# Patient Record
Sex: Female | Born: 1980 | Race: White | Hispanic: No | Marital: Single | Smoking: Never smoker
Health system: Southern US, Community
[De-identification: ages and names within clinical notes are randomized; demographics above are authoritative.]

## PROBLEM LIST (undated history)

## (undated) DIAGNOSIS — F39 Unspecified mood [affective] disorder: Secondary | ICD-10-CM

## (undated) DIAGNOSIS — M2550 Pain in unspecified joint: Secondary | ICD-10-CM

## (undated) DIAGNOSIS — M549 Dorsalgia, unspecified: Secondary | ICD-10-CM

## (undated) DIAGNOSIS — E669 Obesity, unspecified: Secondary | ICD-10-CM

## (undated) DIAGNOSIS — L709 Acne, unspecified: Secondary | ICD-10-CM

## (undated) DIAGNOSIS — I10 Essential (primary) hypertension: Secondary | ICD-10-CM

## (undated) DIAGNOSIS — K589 Irritable bowel syndrome without diarrhea: Secondary | ICD-10-CM

## (undated) DIAGNOSIS — J45909 Unspecified asthma, uncomplicated: Secondary | ICD-10-CM

## (undated) DIAGNOSIS — F419 Anxiety disorder, unspecified: Secondary | ICD-10-CM

## (undated) DIAGNOSIS — E282 Polycystic ovarian syndrome: Secondary | ICD-10-CM

## (undated) DIAGNOSIS — E7800 Pure hypercholesterolemia, unspecified: Secondary | ICD-10-CM

## (undated) DIAGNOSIS — E559 Vitamin D deficiency, unspecified: Secondary | ICD-10-CM

## (undated) DIAGNOSIS — K5900 Constipation, unspecified: Secondary | ICD-10-CM

## (undated) HISTORY — DX: Irritable bowel syndrome, unspecified: K58.9

## (undated) HISTORY — DX: Unspecified mood (affective) disorder: F39

## (undated) HISTORY — DX: Polycystic ovarian syndrome: E28.2

## (undated) HISTORY — DX: Dorsalgia, unspecified: M54.9

## (undated) HISTORY — DX: Constipation, unspecified: K59.00

## (undated) HISTORY — DX: Acne, unspecified: L70.9

## (undated) HISTORY — DX: Pure hypercholesterolemia, unspecified: E78.00

## (undated) HISTORY — DX: Essential (primary) hypertension: I10

## (undated) HISTORY — PX: BACK SURGERY: SHX140

## (undated) HISTORY — DX: Vitamin D deficiency, unspecified: E55.9

## (undated) HISTORY — DX: Anxiety disorder, unspecified: F41.9

## (undated) HISTORY — DX: Unspecified asthma, uncomplicated: J45.909

## (undated) HISTORY — DX: Pain in unspecified joint: M25.50

## (undated) HISTORY — DX: Obesity, unspecified: E66.9

---
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
## 2001-11-26 ENCOUNTER — Other Ambulatory Visit: Admission: RE | Admit: 2001-11-26 | Discharge: 2001-11-26 | Payer: Self-pay | Admitting: Gynecology

## 2003-01-03 ENCOUNTER — Other Ambulatory Visit: Admission: RE | Admit: 2003-01-03 | Discharge: 2003-01-03 | Payer: Self-pay | Admitting: Gynecology

## 2005-04-05 ENCOUNTER — Other Ambulatory Visit: Admission: RE | Admit: 2005-04-05 | Discharge: 2005-04-05 | Payer: Self-pay | Admitting: Family Medicine

## 2007-02-14 ENCOUNTER — Other Ambulatory Visit: Admission: RE | Admit: 2007-02-14 | Discharge: 2007-02-14 | Payer: Self-pay | Admitting: Family Medicine

## 2008-03-26 ENCOUNTER — Other Ambulatory Visit: Admission: RE | Admit: 2008-03-26 | Discharge: 2008-03-26 | Payer: Self-pay | Admitting: Family Medicine

## 2009-03-31 ENCOUNTER — Other Ambulatory Visit: Admission: RE | Admit: 2009-03-31 | Discharge: 2009-03-31 | Payer: Self-pay | Admitting: Family Medicine

## 2010-05-12 ENCOUNTER — Other Ambulatory Visit: Payer: Self-pay | Admitting: Family Medicine

## 2010-05-12 ENCOUNTER — Other Ambulatory Visit (HOSPITAL_COMMUNITY)
Admission: RE | Admit: 2010-05-12 | Discharge: 2010-05-12 | Disposition: A | Discharge status: Home / Self Care | Payer: BC Managed Care – PPO | Source: Ambulatory Visit | Attending: Family Medicine | Admitting: Family Medicine

## 2010-05-12 DIAGNOSIS — Z124 Encounter for screening for malignant neoplasm of cervix: Secondary | ICD-10-CM | POA: Insufficient documentation

## 2011-08-05 ENCOUNTER — Ambulatory Visit: Payer: Self-pay | Admitting: Sports Medicine

## 2012-02-15 HISTORY — PX: BACK SURGERY: SHX140

## 2013-02-14 DIAGNOSIS — F419 Anxiety disorder, unspecified: Secondary | ICD-10-CM

## 2013-02-14 HISTORY — PX: COLPOSCOPY W/ BIOPSY / CURETTAGE: SUR283

## 2013-02-14 HISTORY — DX: Anxiety disorder, unspecified: F41.9

## 2015-03-26 LAB — HM PAP SMEAR

## 2017-01-25 DIAGNOSIS — E66812 Obesity, class 2: Secondary | ICD-10-CM | POA: Insufficient documentation

## 2017-01-25 DIAGNOSIS — Z6839 Body mass index (BMI) 39.0-39.9, adult: Secondary | ICD-10-CM | POA: Insufficient documentation

## 2017-03-26 NOTE — Progress Notes (Addendum)
Gynecology Annual Exam   PCP: Patient, No Pcp Per  Chief Complaint:  Chief Complaint  Patient presents with  . Gynecologic Exam    bleeding after intercourse sm.cramping  . Menorrhagia    History of Present Illness: Patient is a 37 y.o. G0P0000 presents for annual exam. The patient has no complaints today.   LMP: Patient's last menstrual period was 02/15/2017 (exact date). Irregular bleeding with IUD  The patient is sexually active. She currently uses IUD for contraception. She has dyspareunia.  The patient does perform self breast exams.  There is no notable family history of breast or ovarian cancer in her family.  The patient wears seatbelts: yes.   The patient has regular exercise: not asked.    The patient denies current symptoms of depression.    Review of Systems: ROS  Past Medical History:  Past Medical History:  Diagnosis Date  . Acne   . Anxiety 2015  . Backache   . Hypertension   . Mood disorder (HCC)   . Obesity   . PCOS (polycystic ovarian syndrome)     Past Surgical History:  Past Surgical History:  Procedure Laterality Date  . BACK SURGERY    . COLPOSCOPY W/ BIOPSY / CURETTAGE  2015   2016, 2017    Gynecologic History:  Patient's last menstrual period was 02/15/2017 (exact date). 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 Hypertension Father   . COPD Maternal Grandmother   . Brain cancer Paternal Grandfather   . Lung cancer Paternal Grandfather   . Hypertension Paternal Grandfather     Social History:  Social History   Socioeconomic History  . Marital status: Single    Spouse  name: Not on file  . Number of children: Not on file  . Years of education: Not on file  . Highest education level: Not on file  Social Needs  . Financial resource strain: Not on file  . Food insecurity - worry: Not on file  . Food insecurity - inability: Not on file  . Transportation needs - medical: Not on file  . Transportation needs - non-medical: Not on file  Occupational History  . Not on file  Tobacco Use  . Smoking status: Never Smoker  . Smokeless tobacco: Never Used  Substance and Sexual Activity  . Alcohol use: Yes    Comment: rare  . Drug use: No  . Sexual activity: Yes    Partners: Male    Birth control/protection: IUD  Other Topics Concern  . Not on file  Social History Narrative  . Not on file    Allergies:  Allergies no known allergies  Medications: Prior to Admission medications   Not on File    Physical Exam Vitals: There were no vitals taken for this visit.  General: NAD HEENT: normocephalic, anicteric Thyroid: no enlargement, no palpable nodules Pulmonary: No increased work of breathing, CTAB Cardiovascular: RRR, distal pulses 2+ Breast: Breast symmetrical, no tenderness, no palpable nodules or masses, no skin or nipple retraction present, no nipple discharge.  No axillary or supraclavicular lymphadenopathy. Abdomen: NABS, soft, non-tender, non-distended.  Umbilicus without lesions.  No hepatomegaly, splenomegaly or masses palpable. No evidence of hernia  Genitourinary:  External: Normal external female genitalia.  Normal urethral meatus, normal  Bartholin's and Skene's glands.    Vagina: Normal vaginal mucosa, no evidence of prolapse.    Cervix: Grossly normal in appearance, no bleeding, IUD strings visualized  Uterus: Non-enlarged, mobile, normal contour.  No CMT  Adnexa: ovaries non-enlarged, no adnexal masses  Rectal: deferred  Lymphatic: no evidence of inguinal lymphadenopathy Extremities: no edema, erythema, or tenderness Neurologic:  Grossly intact Psychiatric: mood appropriate, affect full  Female chaperone present for pelvic and breast  portions of the physical exam    Assessment: 37 y.o. G0P0000 routine annual exam  Plan: Problem List Items Addressed This Visit    None    Visit Diagnoses    Screening for malignant neoplasm of cervix       Relevant Orders   PapIG, HPV, rfx 16/18   Breast screening       Encounter for gynecological examination without abnormal finding          1) STI screening was not offered  2)  ASCCP guidelines and rational discussed.  Repeat pap following normal colposcopy 2017  3) Contraception - the patient is currently using  IUD.  She is happy with her current form of contraception and plans to continue  - placed 09/2014  4) Routine healthcare maintenance including cholesterol, diabetes screening discussed managed by PCP  5) Return in 1 year (on 03/27/2018) for Annual.   Vena AustriaAndreas Eryn Marandola, MD, Merlinda FrederickFACOG [facility] 03/27/2017, 5:26 PM

## 2017-03-27 ENCOUNTER — Encounter: Payer: Self-pay | Admitting: Obstetrics and Gynecology

## 2017-03-27 ENCOUNTER — Ambulatory Visit (INDEPENDENT_AMBULATORY_CARE_PROVIDER_SITE_OTHER): Payer: BC Managed Care – PPO | Admitting: Obstetrics and Gynecology

## 2017-03-27 DIAGNOSIS — Z01419 Encounter for gynecological examination (general) (routine) without abnormal findings: Secondary | ICD-10-CM | POA: Diagnosis not present

## 2017-03-27 DIAGNOSIS — Z124 Encounter for screening for malignant neoplasm of cervix: Secondary | ICD-10-CM | POA: Diagnosis not present

## 2017-03-27 DIAGNOSIS — Z1231 Encounter for screening mammogram for malignant neoplasm of breast: Secondary | ICD-10-CM

## 2017-03-27 DIAGNOSIS — Z1239 Encounter for other screening for malignant neoplasm of breast: Secondary | ICD-10-CM

## 2017-03-27 NOTE — Patient Instructions (Signed)
Preventive Care 18-39 Years, Female Preventive care refers to lifestyle choices and visits with your health care provider that can promote health and wellness. What does preventive care include?  A yearly physical exam. This is also called an annual well check.  Dental exams once or twice a year.  Routine eye exams. Ask your health care provider how often you should have your eyes checked.  Personal lifestyle choices, including: ? Daily care of your teeth and gums. ? Regular physical activity. ? Eating a healthy diet. ? Avoiding tobacco and drug use. ? Limiting alcohol use. ? Practicing safe sex. ? Taking vitamin and mineral supplements as recommended by your health care provider. What happens during an annual well check? The services and screenings done by your health care provider during your annual well check will depend on your age, overall health, lifestyle risk factors, and family history of disease. Counseling Your health care provider may ask you questions about your:  Alcohol use.  Tobacco use.  Drug use.  Emotional well-being.  Home and relationship well-being.  Sexual activity.  Eating habits.  Work and work Statistician.  Method of birth control.  Menstrual cycle.  Pregnancy history.  Screening You may have the following tests or measurements:  Height, weight, and BMI.  Diabetes screening. This is done by checking your blood sugar (glucose) after you have not eaten for a while (fasting).  Blood pressure.  Lipid and cholesterol levels. These may be checked every 5 years starting at age 66.  Skin check.  Hepatitis C blood test.  Hepatitis B blood test.  Sexually transmitted disease (STD) testing.  BRCA-related cancer screening. This may be done if you have a family history of breast, ovarian, tubal, or peritoneal cancers.  Pelvic exam and Pap test. This may be done every 3 years starting at age 40. Starting at age 59, this may be done every 5  years if you have a Pap test in combination with an HPV test.  Discuss your test results, treatment options, and if necessary, the need for more tests with your health care provider. Vaccines Your health care provider may recommend certain vaccines, such as:  Influenza vaccine. This is recommended every year.  Tetanus, diphtheria, and acellular pertussis (Tdap, Td) vaccine. You may need a Td booster every 10 years.  Varicella vaccine. You may need this if you have not been vaccinated.  HPV vaccine. If you are 69 or younger, you may need three doses over 6 months.  Measles, mumps, and rubella (MMR) vaccine. You may need at least one dose of MMR. You may also need a second dose.  Pneumococcal 13-valent conjugate (PCV13) vaccine. You may need this if you have certain conditions and were not previously vaccinated.  Pneumococcal polysaccharide (PPSV23) vaccine. You may need one or two doses if you smoke cigarettes or if you have certain conditions.  Meningococcal vaccine. One dose is recommended if you are age 27-21 years and a first-year college student living in a residence hall, or if you have one of several medical conditions. You may also need additional booster doses.  Hepatitis A vaccine. You may need this if you have certain conditions or if you travel or work in places where you may be exposed to hepatitis A.  Hepatitis B vaccine. You may need this if you have certain conditions or if you travel or work in places where you may be exposed to hepatitis B.  Haemophilus influenzae type b (Hib) vaccine. You may need this if  you have certain risk factors.  Talk to your health care provider about which screenings and vaccines you need and how often you need them. This information is not intended to replace advice given to you by your health care provider. Make sure you discuss any questions you have with your health care provider. Document Released: 03/29/2001 Document Revised: 10/21/2015  Document Reviewed: 12/02/2014 Elsevier Interactive Patient Education  [employer].

## 2017-03-30 LAB — PAPIG, HPV, RFX 16/18
HPV, high-risk: POSITIVE — AB
PAP Smear Comment: 0

## 2017-04-05 ENCOUNTER — Encounter: Payer: Self-pay | Admitting: Obstetrics and Gynecology

## 2017-04-06 ENCOUNTER — Encounter: Payer: Self-pay | Admitting: Obstetrics and Gynecology

## 2017-04-06 ENCOUNTER — Telehealth: Payer: Self-pay | Admitting: Obstetrics and Gynecology

## 2017-04-06 ENCOUNTER — Ambulatory Visit (INDEPENDENT_AMBULATORY_CARE_PROVIDER_SITE_OTHER): Payer: BC Managed Care – PPO | Admitting: Obstetrics and Gynecology

## 2017-04-06 VITALS — BP 126/84 | Ht 67.0 in | Wt 266.0 lb

## 2017-04-06 DIAGNOSIS — B373 Candidiasis of vulva and vagina: Secondary | ICD-10-CM

## 2017-04-06 DIAGNOSIS — Z113 Encounter for screening for infections with a predominantly sexual mode of transmission: Secondary | ICD-10-CM

## 2017-04-06 DIAGNOSIS — B3731 Acute candidiasis of vulva and vagina: Secondary | ICD-10-CM

## 2017-04-06 LAB — POCT WET PREP WITH KOH
Clue Cells Wet Prep HPF POC: NEGATIVE
KOH Prep POC: NEGATIVE
Trichomonas, UA: NEGATIVE

## 2017-04-06 MED ORDER — FLUCONAZOLE 150 MG PO TABS: 150.0000 mg | ORAL_TABLET | Freq: Once | ORAL | 0 refills | Status: AC

## 2017-04-06 NOTE — Progress Notes (Signed)
Chief Complaint  Patient presents with  . Vaginitis    HPI:      Ms. Carla Watson is a 37 y.o. G0P0000 who LMP was No LMP recorded. Patient is not currently having periods (Reason: IUD)., presents today for vaginal itching/irritation with possibly a little increased d/c, no odor. Sx for about 6 days. No meds to treat, no recent abx use. No urin sx, LBP, fevers, belly pain. New sex partner. Never had yeast vag before. Has colpo sched in 3/19.   Past Medical History:  Diagnosis Date  . Acne   . Anxiety 2015  . Backache   . Hypertension   . Mood disorder (HCC)   . Obesity   . PCOS (polycystic ovarian syndrome)     Past Surgical History:  Procedure Laterality Date  . BACK SURGERY    . COLPOSCOPY W/ BIOPSY / CURETTAGE  2015   2016, 2017    Family History  Problem Relation Age of Onset  . Hypertension Father   . COPD Maternal Grandmother   . Brain cancer Paternal Grandfather   . Lung cancer Paternal Grandfather   . Hypertension Paternal Grandfather     Social History   Socioeconomic History  . Marital status: Single    Spouse name: Not on file  . Number of children: Not on file  . Years of education: Not on file  . Highest education level: Not on file  Social Needs  . Financial resource strain: Not on file  . Food insecurity - worry: Not on file  . Food insecurity - inability: Not on file  . Transportation needs - medical: Not on file  . Transportation needs - non-medical: Not on file  Occupational History  . Not on file  Tobacco Use  . Smoking status: Never Smoker  . Smokeless tobacco: Never Used  Substance and Sexual Activity  . Alcohol use: Yes    Comment: rare  . Drug use: No  . Sexual activity: Yes    Partners: Male    Birth control/protection: IUD  Other Topics Concern  . Not on file  Social History Narrative  . Not on file     Current Outpatient Medications:  .  fluconazole (DIFLUCAN) 150 MG tablet, Take 1 tablet (150 mg total) by mouth  once for 1 dose., Disp: 1 tablet, Rfl: 0 .  levonorgestrel (MIRENA, 52 MG,) 20 MCG/24HR IUD, by Intrauterine route., Disp: , Rfl:  .  lisinopril-hydrochlorothiazide (PRINZIDE,ZESTORETIC) 10-12.5 MG tablet, Take by mouth., Disp: , Rfl:    ROS:  Review of Systems  Constitutional: Negative for fever.  Gastrointestinal: Negative for blood in stool, constipation, diarrhea, nausea and vomiting.  Genitourinary: Positive for vaginal discharge. Negative for dyspareunia, dysuria, flank pain, frequency, hematuria, urgency, vaginal bleeding and vaginal pain.  Musculoskeletal: Negative for back pain.  Skin: Negative for rash.     OBJECTIVE:   Vitals:  BP 126/84   Ht 5\' 7"  (1.702 m)   Wt 266 lb (120.7 kg)   BMI 41.66 kg/m   Physical Exam  Constitutional: She is oriented to person, place, and time and well-developed, well-nourished, and in no distress. Vital signs are normal.  Genitourinary: Uterus normal, cervix normal, right adnexa normal and left adnexa normal. Uterus is not enlarged. Cervix exhibits no motion tenderness and no tenderness. Right adnexum displays no mass and no tenderness. Left adnexum displays no mass and no tenderness. Vulva exhibits erythema and tenderness. Vulva exhibits no exudate, no lesion and no rash. Vagina exhibits  no lesion. Thin  white and vaginal discharge found.  Neurological: She is oriented to person, place, and time.  Vitals reviewed.   Results: Results for orders placed or performed in visit on 04/06/17 (from the past 24 hour(s))  POCT Wet Prep with KOH     Status: Abnormal   Collection Time: 04/06/17  4:14 PM  Result Value Ref Range   Trichomonas, UA Negative    Clue Cells Wet Prep HPF POC neg    Epithelial Wet Prep HPF POC  Few, Moderate, Many, Too numerous to count   Yeast Wet Prep HPF POC few    Bacteria Wet Prep HPF POC  Few   RBC Wet Prep HPF POC     WBC Wet Prep HPF POC     KOH Prep POC Negative Negative     Assessment/Plan: Candidal  vaginitis - Pos wet prep/sx. Rx diflucan. F/u prn.  - Plan: POCT Wet Prep with KOH, fluconazole (DIFLUCAN) 150 MG tablet  Screening for STD (sexually transmitted disease) - Plan: Chlamydia/Gonococcus/Trichomonas, NAA   Meds ordered this encounter  Medications  . fluconazole (DIFLUCAN) 150 MG tablet    Sig: Take 1 tablet (150 mg total) by mouth once for 1 dose.    Dispense:  1 tablet    Refill:  0    Order Specific Question:   Supervising Provider    Answer:   Nadara Mustard [[id number]]      Return if symptoms worsen or fail to improve.  Osman Calzadilla B. Gusta Marksberry, PA-C 04/06/2017 4:14 PM

## 2017-04-06 NOTE — Telephone Encounter (Signed)
-----   Message from Vena AustriaAndreas Staebler, MD sent at 04/05/2017  1:01 PM EST ----- Regarding: colposcopy Needs follow up colposcopy 2-6 weeks

## 2017-04-06 NOTE — Patient Instructions (Signed)
I value your feedback and entrusting us with your care. If you get a [facility] patient survey, I would appreciate you taking the time to let us know about your experience today. Thank you! 

## 2017-04-06 NOTE — Telephone Encounter (Signed)
Called and spoke with patient about scheduling Colpo. Pt states she needs to give us a call back to schedule appt

## 2017-04-09 LAB — CHLAMYDIA/GONOCOCCUS/TRICHOMONAS, NAA
Chlamydia by NAA: NEGATIVE
Gonococcus by NAA: NEGATIVE
Trich vag by NAA: NEGATIVE

## 2017-04-14 ENCOUNTER — Encounter: Payer: Self-pay | Admitting: Obstetrics and Gynecology

## 2017-04-17 ENCOUNTER — Other Ambulatory Visit: Payer: Self-pay | Admitting: Obstetrics and Gynecology

## 2017-04-17 ENCOUNTER — Encounter: Payer: Self-pay | Admitting: Obstetrics and Gynecology

## 2017-04-17 MED ORDER — TERCONAZOLE 0.8 % VA CREA: 1.0000 | TOPICAL_CREAM | Freq: Every day | VAGINAL | 0 refills | Status: AC

## 2017-04-17 NOTE — Progress Notes (Signed)
Rx terazol since still sx after diflucan.

## 2017-04-17 NOTE — Telephone Encounter (Signed)
Do you want to send in  something else or have pt come in ?

## 2017-04-18 ENCOUNTER — Encounter: Payer: Self-pay | Admitting: Obstetrics and Gynecology

## 2017-04-18 ENCOUNTER — Ambulatory Visit (INDEPENDENT_AMBULATORY_CARE_PROVIDER_SITE_OTHER): Payer: BC Managed Care – PPO | Admitting: Obstetrics and Gynecology

## 2017-04-18 VITALS — BP 122/62 | HR 76 | Ht 67.0 in | Wt 266.0 lb

## 2017-04-18 DIAGNOSIS — B977 Papillomavirus as the cause of diseases classified elsewhere: Secondary | ICD-10-CM

## 2017-04-18 DIAGNOSIS — N72 Inflammatory disease of cervix uteri: Secondary | ICD-10-CM

## 2017-04-18 DIAGNOSIS — R87612 Low grade squamous intraepithelial lesion on cytologic smear of cervix (LGSIL): Secondary | ICD-10-CM

## 2017-04-18 NOTE — Progress Notes (Signed)
   GYNECOLOGY CLINIC COLPOSCOPY PROCEDURE NOTE  37 y.o. G0P0000 here for colposcopy for HR HPV positive pap smear on 03/27/17. Discussed underlying role for HPV infection in the development of cervical dysplasia, its natural history and progression/regression, need for surveillance.  Is the patient  pregnant: No LMP: Patient's last menstrual period was 04/15/2017. Smoking status:  reports that  has never smoked. she has never used smokeless tobacco. Contraception: IUD Future fertility desired:  Yes  Patient given informed consent, signed copy in the chart, time out was performed.  The patient was position in dorsal lithotomy position. Speculum was placed the cervix was visualized.   After application of acetic acid colposcopic inspection of the cervix was undertaken.   Colposcopy adequate, full visualization of transformation zone: Yes no visible lesions; random 12 O'Clock biopsy obtained, string were not visualized (were seen last month at annual exam) ECC specimen obtained:  Yes  All specimens were labeled and sent to pathology.   Patient was given post procedure instructions.  Will follow up pathology and manage accordingly.  Routine preventative health maintenance measures emphasized.  OBGyn Exam  Vena AustriaAndreas Corazon Nickolas, MD, Merlinda FrederickFACOG [facility], [facility]

## 2017-04-20 LAB — PATHOLOGY

## 2017-04-28 ENCOUNTER — Telehealth: Payer: Self-pay | Admitting: Obstetrics and Gynecology

## 2017-04-28 ENCOUNTER — Encounter: Payer: Self-pay | Admitting: Obstetrics and Gynecology

## 2017-04-28 NOTE — Telephone Encounter (Signed)
-----   Message from Vena AustriaAndreas Staebler, MD sent at 04/28/2017  1:05 PM EDT ----- Regarding: Colposcopy Needs colposcopy in 6 months

## 2017-04-28 NOTE — Telephone Encounter (Signed)
Called and left voice mail for patient to call back to be schedule °

## 2017-05-22 DIAGNOSIS — F418 Other specified anxiety disorders: Secondary | ICD-10-CM | POA: Insufficient documentation

## 2017-06-07 ENCOUNTER — Emergency Department: Payer: BC Managed Care – PPO

## 2017-06-07 ENCOUNTER — Other Ambulatory Visit: Payer: Self-pay

## 2017-06-07 ENCOUNTER — Observation Stay
Admission: EM | Admit: 2017-06-07 | Discharge: 2017-06-08 | DRG: 558 | Disposition: A | Discharge status: Home / Self Care | Payer: BC Managed Care – PPO | Attending: Internal Medicine | Admitting: Internal Medicine

## 2017-06-07 DIAGNOSIS — Z8249 Family history of ischemic heart disease and other diseases of the circulatory system: Secondary | ICD-10-CM | POA: Diagnosis not present

## 2017-06-07 DIAGNOSIS — F419 Anxiety disorder, unspecified: Secondary | ICD-10-CM | POA: Diagnosis not present

## 2017-06-07 DIAGNOSIS — Z975 Presence of (intrauterine) contraceptive device: Secondary | ICD-10-CM

## 2017-06-07 DIAGNOSIS — Z801 Family history of malignant neoplasm of trachea, bronchus and lung: Secondary | ICD-10-CM | POA: Diagnosis not present

## 2017-06-07 DIAGNOSIS — I10 Essential (primary) hypertension: Secondary | ICD-10-CM | POA: Diagnosis present

## 2017-06-07 DIAGNOSIS — Z79899 Other long term (current) drug therapy: Secondary | ICD-10-CM | POA: Diagnosis not present

## 2017-06-07 DIAGNOSIS — E669 Obesity, unspecified: Secondary | ICD-10-CM | POA: Diagnosis not present

## 2017-06-07 DIAGNOSIS — E282 Polycystic ovarian syndrome: Secondary | ICD-10-CM | POA: Diagnosis not present

## 2017-06-07 DIAGNOSIS — R609 Edema, unspecified: Secondary | ICD-10-CM | POA: Diagnosis present

## 2017-06-07 DIAGNOSIS — M7989 Other specified soft tissue disorders: Secondary | ICD-10-CM | POA: Diagnosis present

## 2017-06-07 DIAGNOSIS — Z808 Family history of malignant neoplasm of other organs or systems: Secondary | ICD-10-CM | POA: Diagnosis not present

## 2017-06-07 DIAGNOSIS — Z825 Family history of asthma and other chronic lower respiratory diseases: Secondary | ICD-10-CM

## 2017-06-07 DIAGNOSIS — M6282 Rhabdomyolysis: Principal | ICD-10-CM | POA: Diagnosis present

## 2017-06-07 DIAGNOSIS — Z6841 Body Mass Index (BMI) 40.0 and over, adult: Secondary | ICD-10-CM

## 2017-06-07 LAB — URINALYSIS, COMPLETE (UACMP) WITH MICROSCOPIC
Bacteria, UA: NONE SEEN
Bilirubin Urine: NEGATIVE
Glucose, UA: NEGATIVE mg/dL
Ketones, ur: 20 mg/dL — AB
Leukocytes, UA: NEGATIVE
Nitrite: NEGATIVE
Protein, ur: NEGATIVE mg/dL
Specific Gravity, Urine: 1.008 (ref 1.005–1.030)
pH: 5 (ref 5.0–8.0)

## 2017-06-07 LAB — CBC
HCT: 42 % (ref 35.0–47.0)
Hemoglobin: 14.7 g/dL (ref 12.0–16.0)
MCH: 29.8 pg (ref 26.0–34.0)
MCHC: 35 g/dL (ref 32.0–36.0)
MCV: 85.1 fL (ref 80.0–100.0)
Platelets: 271 10*3/uL (ref 150–440)
RBC: 4.94 MIL/uL (ref 3.80–5.20)
RDW: 13.5 % (ref 11.5–14.5)
WBC: 13.6 10*3/uL — ABNORMAL HIGH (ref 3.6–11.0)

## 2017-06-07 LAB — BASIC METABOLIC PANEL
Anion gap: 8 (ref 5–15)
BUN: 14 mg/dL (ref 6–20)
CO2: 25 mmol/L (ref 22–32)
Calcium: 9.1 mg/dL (ref 8.9–10.3)
Chloride: 104 mmol/L (ref 101–111)
Creatinine, Ser: 0.57 mg/dL (ref 0.44–1.00)
GFR calc Af Amer: >60 mL/min (ref >60)
GFR calc non Af Amer: >60 mL/min (ref >60)
Glucose, Bld: 97 mg/dL (ref 65–99)
Potassium: 3.3 mmol/L — ABNORMAL LOW (ref 3.5–5.1)
Sodium: 137 mmol/L (ref 135–145)

## 2017-06-07 LAB — CK: Total CK: 7707 U/L — ABNORMAL HIGH (ref 38–234)

## 2017-06-07 LAB — POC URINE PREG, ED: Preg Test, Ur: NEGATIVE

## 2017-06-07 MED ORDER — ONDANSETRON HCL 4 MG PO TABS: 4.0000 mg | ORAL_TABLET | Freq: Four times a day (QID) | ORAL | Status: DC | PRN

## 2017-06-07 MED ORDER — ENOXAPARIN SODIUM 40 MG/0.4ML ~~LOC~~ SOLN
40.0000 mg | SUBCUTANEOUS | Status: DC
  Filled 2017-06-07 (×2): qty 0.4

## 2017-06-07 MED ORDER — STERILE WATER FOR INJECTION IV SOLN
INTRAVENOUS | Status: DC
  Administered 2017-06-07 – 2017-06-08 (×3): via INTRAVENOUS
  Filled 2017-06-07 (×7): qty 9.71

## 2017-06-07 MED ORDER — ACETAMINOPHEN 325 MG PO TABS: 650.0000 mg | ORAL_TABLET | Freq: Four times a day (QID) | ORAL | Status: DC | PRN

## 2017-06-07 MED ORDER — ACETAMINOPHEN 650 MG RE SUPP
650.0000 mg | Freq: Four times a day (QID) | RECTAL | Status: DC | PRN
  Filled 2017-06-07: qty 1

## 2017-06-07 MED ORDER — SENNOSIDES-DOCUSATE SODIUM 8.6-50 MG PO TABS: 1.0000 | ORAL_TABLET | Freq: Every evening | ORAL | Status: DC | PRN

## 2017-06-07 MED ORDER — ONDANSETRON HCL 4 MG/2ML IJ SOLN: 4.0000 mg | Freq: Four times a day (QID) | INTRAMUSCULAR | Status: DC | PRN

## 2017-06-07 MED ORDER — POTASSIUM CHLORIDE CRYS ER 20 MEQ PO TBCR
40.0000 meq | EXTENDED_RELEASE_TABLET | Freq: Once | ORAL | Status: AC
  Administered 2017-06-07: 21:00:00 40 meq via ORAL
  Filled 2017-06-07: qty 2

## 2017-06-07 NOTE — ED Provider Notes (Signed)
[facility] Emergency Department Provider Note  ____________________________________________   First MD Initiated Contact with Patient 06/07/17 1416     (approximate)  I have reviewed the triage vital signs and the nursing notes.   HISTORY  Chief Complaint other (right arm swelling)   HPI Carla Watson is a 37 y.o. female with a history of hypertension and PCOS was presented to the emergency department with right arm swelling that started last night into today.  Says that she was doing garden work yesterday and thinks she may been bitten by something.  However, she denies any itching.  Says that the arm feels full and there is an area of redness to the medial arm and proximal forearm.  Says that she also exerting herself last Thursday when she was working out but did not have any symptoms at that time.  Denies fever.  Denies history of cellulitis.   Past Medical History:  Diagnosis Date  . Acne   . Anxiety 2015  . Backache   . Hypertension   . Mood disorder (HCC)   . Obesity   . PCOS (polycystic ovarian syndrome)     There are no active problems to display for this patient.   Past Surgical History:  Procedure Laterality Date  . BACK SURGERY    . COLPOSCOPY W/ BIOPSY / CURETTAGE  2015   2016, 2017    Prior to Admission medications   Medication Sig Start Date End Date Taking? Authorizing Provider  levonorgestrel (MIRENA, 52 MG,) 20 MCG/24HR IUD by Intrauterine route.    [provider]  lisinopril-hydrochlorothiazide (PRINZIDE,ZESTORETIC) 10-12.5 MG tablet Take by mouth. 10/26/16 10/26/17  [provider]    Allergies Patient has no known allergies.  Family History  Problem Relation Age of Onset  . Hypertension Father   . COPD Maternal Grandmother   . Brain cancer Paternal Grandfather   . Lung cancer Paternal Grandfather   . Hypertension Paternal Grandfather     Social History Social History   Tobacco Use  .  Smoking status: Never Smoker  . Smokeless tobacco: Never Used  Substance Use Topics  . Alcohol use: Yes    Comment: rare  . Drug use: No    Review of Systems  Constitutional: No fever/chills Eyes: No visual changes. ENT: No sore throat. Cardiovascular: Denies chest pain. Respiratory: Denies shortness of breath. Gastrointestinal: No abdominal pain.  No nausea, no vomiting.  No diarrhea.  No constipation. Genitourinary: Negative for dysuria. Musculoskeletal: Negative for back pain. Skin: As above Neurological: Negative for headaches, focal weakness or numbness.   ____________________________________________   PHYSICAL EXAM:  VITAL SIGNS: ED Triage Vitals  Enc Vitals Group     BP 06/07/17 1323 (!) 141/80     Pulse Rate 06/07/17 1323 86     Resp 06/07/17 1323 16     Temp 06/07/17 1323 98.9 F (37.2 C)     Temp Source 06/07/17 1322 Oral     SpO2 06/07/17 1323 97 %     Weight 06/07/17 1321 262 lb (118.8 kg)     Height 06/07/17 1321 5\' 7"  (1.702 m)     Head Circumference --      Peak Flow --      Pain Score 06/07/17 1323 0     Pain Loc --      Pain Edu? --      Excl. in GC? --     Constitutional: Alert and oriented. Well appearing and in no acute  distress. Eyes: Conjunctivae are normal.  Head: Atraumatic. Nose: No congestion/rhinnorhea. Mouth/Throat: Mucous membranes are moist.  Neck: No stridor.   Cardiovascular: Normal rate, regular rhythm. Grossly normal heart sounds.   Respiratory: Normal respiratory effort.  No retractions. Lungs CTAB. Gastrointestinal: Soft and nontender. No distention.  Musculoskeletal: No lower extremity tenderness nor edema.  No joint effusions.  Right upper extremity swelling that starts the proximal forearm and extends to the proximal third of the arm.  There is also an area of erythema medially that extends to the proximal third of the forearm to just distal to the axilla.  There is no induration.  No exudate.  No fluctuance.  Minimal  tenderness to palpation without warmth.  The compartments are soft although there is mild edema on deep palpation to the swollen area.  Distal to the site of the swelling and redness the patient is neurovascularly intact.  Radial pulses intact.  Sensation intact to light touch with 5 out of 5 grip strength.  Neurologic:  Normal speech and language. No gross focal neurologic deficits are appreciated. Skin:  Skin is warm, dry and intact. No rash noted. Psychiatric: Mood and affect are normal. Speech and behavior are normal.  ____________________________________________   LABS (all labs ordered are listed, but only abnormal results are displayed)  Labs Reviewed  CBC - Abnormal; Notable for the following components:      Result Value   WBC 13.6 (*)    All other components within normal limits  BASIC METABOLIC PANEL - Abnormal; Notable for the following components:   Potassium 3.3 (*)    All other components within normal limits  CK - Abnormal; Notable for the following components:   Total CK 7,707 (*)    All other components within normal limits  URINALYSIS, COMPLETE (UACMP) WITH MICROSCOPIC - Abnormal; Notable for the following components:   Color, Urine STRAW (*)    APPearance CLEAR (*)    Hgb urine dipstick SMALL (*)    Ketones, ur 20 (*)    All other components within normal limits  POC URINE PREG, ED   ____________________________________________  EKG   ____________________________________________  RADIOLOGY  Right upper extremity ultrasound negative for DVT. ____________________________________________   PROCEDURES  Procedure(s) performed:   Procedures  Critical Care performed:   ____________________________________________   INITIAL IMPRESSION / ASSESSMENT AND PLAN / ED COURSE  Pertinent labs & imaging results that were available during my care of the patient were reviewed by me and considered in my medical decision making (see chart for details).  DDX:  Cellulitis, compartment syndrome, rhabdomyolysis, DVT As part of my medical decision making, I reviewed the following data within the electronic MEDICAL RECORD NUMBER Notes from prior ED visits  ----------------------------------------- 4:59 PM on 06/07/2017 -----------------------------------------  Patient with CK of 7700.  Presentation likely representative of rhabdomyolysis.  Reexamined the patient and there is no change in exam.  She has no pain at this time.  No increase in swelling and is still neurovascularly intact.  Compartments remain soft and nontender.  Unlikely to be a compartment syndrome at this time.  She is aware of need for admission to the hospital.  Signed out to Dr. Imogene Burn. ____________________________________________   FINAL CLINICAL IMPRESSION(S) / ED DIAGNOSES  Final diagnoses:  Swelling   Rhabdomyolysis.   NEW MEDICATIONS STARTED DURING THIS VISIT:  New Prescriptions   No medications on file     Note:  This document was prepared using Dragon voice recognition software and may include unintentional  dictation errors.     Myrna BlazerSchaevitz, David Matthew, MD 06/07/17 [phone]

## 2017-06-07 NOTE — Progress Notes (Signed)
Advanced care plan.  Purpose of the Encounter: CODE STATUS  Parties in Attendance:Patient  Patient's Decision Capacity:Good Subjective/Patient's story: Presented with right arm pain and swelling Objective/Medical story Has acute rhabdomyolysis Goals of care determination:  Advanced directives discussed Patient wants everything done for now cardiac resuscitation, intubation and ventilator if the need arises CODE STATUS: Full code Time spent discussing advanced care planning: 16 minutes

## 2017-06-07 NOTE — H&P (Signed)
[facility] [place] at [facility]   PATIENT NAME: Carla Watson    MR#:  [id number]  DATE OF BIRTH:  08/25/1980  DATE OF ADMISSION:  06/07/2017  PRIMARY CARE PHYSICIAN: Dan Humphreys, [facility]   REQUESTING/REFERRING PHYSICIAN:   CHIEF COMPLAINT:   Chief Complaint  Patient presents with  . other    right arm swelling    HISTORY OF PRESENT ILLNESS: Carla Watson  is a 37 y.o. female with a known history of hypertension, polycystic ovarian syndrome, obesity presented to the emergency room initially for right arm swelling.  Patient noticed this since yesterday.  She has been trying to lose weight and has been doing a lot of physical workouts in the gymnasium.  Patient has been lifting weights.  She has pain in the right and forearm area and also has some redness.  Patient does not recollect any information of insect bite or any trauma apart from the exercise regimen she is currently involved.  Patient was evaluated in the emergency room with the venous Doppler ultrasound of right upper extremity which showed no DVT.  Pain in the right upper extremity is aching 4 out of 10 on a scale of 1-10.  No restriction in the range of motion CK level was more than 7000 during the evaluation in the emergency room.  Hospitalist service was consulted for rhabdomyolysis.  PAST MEDICAL HISTORY:   Past Medical History:  Diagnosis Date  . Acne   . Anxiety 2015  . Backache   . Hypertension   . Mood disorder (HCC)   . Obesity   . PCOS (polycystic ovarian syndrome)     PAST SURGICAL HISTORY:  Past Surgical History:  Procedure Laterality Date  . BACK SURGERY    . COLPOSCOPY W/ BIOPSY / CURETTAGE  2015   2016, 2017    SOCIAL HISTORY:  Social History   Tobacco Use  . Smoking status: Never Smoker  . Smokeless tobacco: Never Used  Substance Use Topics  . Alcohol use: Yes    Comment: rare    FAMILY HISTORY:  Family History  Problem Relation Age of Onset  .  Hypertension Father   . COPD Maternal Grandmother   . Brain cancer Paternal Grandfather   . Lung cancer Paternal Grandfather   . Hypertension Paternal Grandfather     DRUG ALLERGIES: No Known Allergies  REVIEW OF SYSTEMS:   CONSTITUTIONAL: No fever, fatigue or weakness.  EYES: No blurred or double vision.  EARS, NOSE, AND THROAT: No tinnitus or ear pain.  RESPIRATORY: No cough, shortness of breath, wheezing or hemoptysis.  CARDIOVASCULAR: No chest pain, orthopnea, edema.  GASTROINTESTINAL: No nausea, vomiting, diarrhea or abdominal pain.  GENITOURINARY: No dysuria, hematuria.  ENDOCRINE: No polyuria, nocturia,  HEMATOLOGY: No anemia, easy bruising or bleeding SKIN: No rash or lesion. MUSCULOSKELETAL: Pain and redness right arm and fore arm NEUROLOGIC: No tingling, numbness, weakness.  PSYCHIATRY: No anxiety or depression.   MEDICATIONS AT HOME:  Prior to Admission medications   Medication Sig Start Date End Date Taking? Authorizing Provider  levonorgestrel (MIRENA, 52 MG,) 20 MCG/24HR IUD 1 each by Intrauterine route once.    Yes [provider]  lisinopril-hydrochlorothiazide (PRINZIDE,ZESTORETIC) 10-12.5 MG tablet Take 1 tablet by mouth daily.  10/26/16 10/26/17 Yes [provider]  sertraline (ZOLOFT) 50 MG tablet Take 1 tablet by mouth daily. 05/22/17  Yes [provider]      PHYSICAL EXAMINATION:   VITAL SIGNS: Blood pressure (!) 141/80, pulse  86, temperature 98.9 F (37.2 C), temperature source Oral, resp. rate 16, height 5\' 7"  (1.702 m), weight 118.8 kg (262 lb), SpO2 97 %.  GENERAL:  37 y.o.-year-old patient lying in the bed with no acute distress.  EYES: Pupils equal, round, reactive to light and accommodation. No scleral icterus. Extraocular muscles intact.  HEENT: Head atraumatic, normocephalic. Oropharynx and nasopharynx clear.  NECK:  Supple, no jugular venous distention. No thyroid enlargement, no tenderness.  LUNGS: Normal breath  sounds bilaterally, no wheezing, rales,rhonchi or crepitation. No use of accessory muscles of respiration.  CARDIOVASCULAR: S1, S2 normal. No murmurs, rubs, or gallops.  ABDOMEN: Soft, nontender, nondistended. Bowel sounds present. No organomegaly or mass.  EXTREMITIES: No pedal edema, cyanosis, or clubbing.  Swelling right arm and redness of skin right arm extending in to fore arm NEUROLOGIC: Cranial nerves II through XII are intact. Muscle strength 5/5 in all extremities. Sensation intact. Gait not checked.  PSYCHIATRIC: The patient is alert and oriented x 3.  SKIN: redness of skin right arm and fore arm  LABORATORY PANEL:   CBC Recent Labs  Lab 06/07/17 1329  WBC 13.6*  HGB 14.7  HCT 42.0  PLT 271  MCV 85.1  MCH 29.8  MCHC 35.0  RDW 13.5   ------------------------------------------------------------------------------------------------------------------  Chemistries  Recent Labs  Lab 06/07/17 1329  NA 137  K 3.3*  CL 104  CO2 25  GLUCOSE 97  BUN 14  CREATININE 0.57  CALCIUM 9.1   ------------------------------------------------------------------------------------------------------------------ estimated creatinine clearance is 129.7 mL/min (by C-G formula based on SCr of 0.57 mg/dL). ------------------------------------------------------------------------------------------------------------------ No results for input(s): TSH, T4TOTAL, T3FREE, THYROIDAB in the last 72 hours.  Invalid input(s): FREET3   Coagulation profile No results for input(s): INR, PROTIME in the last 168 hours. ------------------------------------------------------------------------------------------------------------------- No results for input(s): DDIMER in the last 72 hours. -------------------------------------------------------------------------------------------------------------------  Cardiac Enzymes No results for input(s): CKMB, TROPONINI, MYOGLOBIN in the last 168 hours.  Invalid  input(s): CK ------------------------------------------------------------------------------------------------------------------ Invalid input(s): POCBNP  ---------------------------------------------------------------------------------------------------------------  Urinalysis    Component Value Date/Time   COLORURINE STRAW (A) 06/07/2017 1528   APPEARANCEUR CLEAR (A) 06/07/2017 1528   LABSPEC 1.008 06/07/2017 1528   PHURINE 5.0 06/07/2017 1528   GLUCOSEU NEGATIVE 06/07/2017 1528   HGBUR SMALL (A) 06/07/2017 1528   BILIRUBINUR NEGATIVE 06/07/2017 1528   KETONESUR 20 (A) 06/07/2017 1528   PROTEINUR NEGATIVE 06/07/2017 1528   NITRITE NEGATIVE 06/07/2017 1528   LEUKOCYTESUR NEGATIVE 06/07/2017 1528     RADIOLOGY: [place] Venous Img Upper Uni Right  Result Date: 06/07/2017 CLINICAL DATA:  Swelling since last night EXAM: RIGHT UPPER EXTREMITY VENOUS DOPPLER ULTRASOUND TECHNIQUE: Gray-scale sonography with graded compression, as well as color Doppler and duplex ultrasound were performed to evaluate the upper extremity deep venous system from the level of the subclavian vein and including the jugular, axillary, basilic and upper cephalic vein. Spectral Doppler was utilized to evaluate flow at rest and with distal augmentation maneuvers. COMPARISON:  None. FINDINGS: Thrombus within deep veins:  None visualized. Compressibility of deep veins:  Normal. Duplex waveform respiratory phasicity:  Normal. Duplex waveform response to augmentation:  Normal. Venous reflux:  None visualized. Other findings: Limited images of the contralateral left subclavian vein unremarkable. IMPRESSION: 1. Negative for right upper extremity DVT. Electronically Signed   By: Corlis Leak  Hassell M.D.   On: 06/07/2017 14:53    EKG: No orders found for this or any previous visit.  IMPRESSION AND PLAN: 37 year old female patient with history of hypertension, polycystic ovarian syndrome presented to  the emergency room with pain in the  right arm and forearm swelling  Acute rhabdomyolysis Aggressive IV fluid hydration with half-normal saline with sodium bicarbonate Follow-up CK levels every 8 hourly Could be secondary to excessive exercise regimen which patient has recently taken up  Right arm and forearm swelling and pain Could be secondary to muscle tear from the exercise regimen and lifting weights  Hypertension  Continue oral lisinopril  polycystic ovarian syndrome Outpatient GYN follow-up  DVT prophylaxis subcu Lovenox 40 daily  All the records are reviewed and case discussed with ED provider. Management plans discussed with the patient, family and they are in agreement.  CODE STATUS:Full code    Code Status Orders  (From admission, onward)        Start     Ordered   06/07/17 1727  Full code  Continuous     06/07/17 1728    Code Status History    This patient has a current code status but no historical code status.       TOTAL TIME TAKING CARE OF THIS PATIENT: 51 minutes.    Ihor Austin M.D on 06/07/2017 at 6:37 PM  Between 7am to 6pm - Pager - [phone]  After 6pm go to [email or web address] - password [email or web address] [facility]  [facility]  Office  [phone]  CC: Primary care physician; [facility]

## 2017-06-07 NOTE — ED Triage Notes (Signed)
Pt c/o right arm swelling that began last night. No complaints of pain or tenderness. Respirations even and non labored.

## 2017-06-08 ENCOUNTER — Inpatient Hospital Stay: Payer: BC Managed Care – PPO

## 2017-06-08 LAB — CBC
HCT: 38.6 % (ref 35.0–47.0)
Hemoglobin: 13.6 g/dL (ref 12.0–16.0)
MCH: 30.1 pg (ref 26.0–34.0)
MCHC: 35.3 g/dL (ref 32.0–36.0)
MCV: 85.3 fL (ref 80.0–100.0)
Platelets: 264 10*3/uL (ref 150–440)
RBC: 4.53 MIL/uL (ref 3.80–5.20)
RDW: 13.3 % (ref 11.5–14.5)
WBC: 11.3 10*3/uL — ABNORMAL HIGH (ref 3.6–11.0)

## 2017-06-08 LAB — BASIC METABOLIC PANEL
Anion gap: 7 (ref 5–15)
BUN: 15 mg/dL (ref 6–20)
CO2: 28 mmol/L (ref 22–32)
Calcium: 8.4 mg/dL — ABNORMAL LOW (ref 8.9–10.3)
Chloride: 101 mmol/L (ref 101–111)
Creatinine, Ser: 0.73 mg/dL (ref 0.44–1.00)
GFR calc Af Amer: >60 mL/min (ref >60)
GFR calc non Af Amer: >60 mL/min (ref >60)
Glucose, Bld: 113 mg/dL — ABNORMAL HIGH (ref 65–99)
Potassium: 3 mmol/L — ABNORMAL LOW (ref 3.5–5.1)
Sodium: 136 mmol/L (ref 135–145)

## 2017-06-08 LAB — CK
Total CK: 5211 U/L — ABNORMAL HIGH (ref 38–234)
Total CK: 4301 U/L — ABNORMAL HIGH (ref 38–234)

## 2017-06-08 LAB — MAGNESIUM: Magnesium: 1.9 mg/dL (ref 1.7–2.4)

## 2017-06-08 MED ORDER — ENOXAPARIN SODIUM 40 MG/0.4ML ~~LOC~~ SOLN
40.0000 mg | Freq: Two times a day (BID) | SUBCUTANEOUS | Status: DC
  Administered 2017-06-08: 40 mg via SUBCUTANEOUS
  Filled 2017-06-08: qty 0.4

## 2017-06-08 MED ORDER — LISINOPRIL-HYDROCHLOROTHIAZIDE 10-12.5 MG PO TABS: 1.0000 | ORAL_TABLET | Freq: Every day | ORAL | Status: DC

## 2017-06-08 MED ORDER — POTASSIUM CHLORIDE CRYS ER 20 MEQ PO TBCR
40.0000 meq | EXTENDED_RELEASE_TABLET | Freq: Once | ORAL | Status: AC
  Administered 2017-06-08: 10:00:00 40 meq via ORAL
  Filled 2017-06-08: qty 2

## 2017-06-08 MED ORDER — CEPHALEXIN 500 MG PO CAPS: 500.0000 mg | ORAL_CAPSULE | Freq: Three times a day (TID) | ORAL | 0 refills | Status: AC

## 2017-06-08 MED ORDER — SERTRALINE HCL 50 MG PO TABS
50.0000 mg | ORAL_TABLET | Freq: Every day | ORAL | Status: DC
  Administered 2017-06-08: 11:00:00 50 mg via ORAL
  Filled 2017-06-08: qty 1

## 2017-06-08 MED ORDER — LISINOPRIL 10 MG PO TABS
10.0000 mg | ORAL_TABLET | Freq: Every day | ORAL | Status: DC
  Administered 2017-06-08: 10 mg via ORAL
  Filled 2017-06-08: qty 1

## 2017-06-08 MED ORDER — HYDROCHLOROTHIAZIDE 12.5 MG PO CAPS
12.5000 mg | ORAL_CAPSULE | Freq: Every day | ORAL | Status: DC
  Administered 2017-06-08: 12.5 mg via ORAL
  Filled 2017-06-08: qty 1

## 2017-06-08 NOTE — Discharge Summary (Signed)
[facility] - [place] at [facility]   PATIENT NAME: Carla Watson    MR#:  [id number]  DATE OF BIRTH:  September 16, 1980  DATE OF ADMISSION:  06/07/2017 ADMITTING PHYSICIAN: Ihor AustinPavan Pyreddy, MD  DATE OF DISCHARGE: 06/08/2017  PRIMARY CARE PHYSICIAN: [place], [facility]    ADMISSION DIAGNOSIS:  Swelling [R60.9] Non-traumatic rhabdomyolysis [M62.82]  DISCHARGE DIAGNOSIS:  Active Problems:   Rhabdomyolysis   SECONDARY DIAGNOSIS:   Past Medical History:  Diagnosis Date  . Acne   . Anxiety 2015  . Backache   . Hypertension   . Mood disorder (HCC)   . Obesity   . PCOS (polycystic ovarian syndrome)     HOSPITAL COURSE:   37 year old female with a history of PCOS who presented with right arm swelling.  1.  Acute rhabdomyolysis: This is from the right arm swelling. CK has improved  2.  Right arm swelling after exercising: Dopplers were negative for DVT MRI shows rhabdo or possible infection. Empirically started on Keflex.  I feel like her swelling is probably due to rhabdomyolysis.  She was asked not to participate in heavy exertional activity using her upper arms to make sure she stays well-hydrated.    3.  Essential hypertension: Continue lisinopril/HCTZ  4.  PCOS   DISCHARGE CONDITIONS AND DIET:   Stable for discharge on regular diet  CONSULTS OBTAINED:    DRUG ALLERGIES:  No Known Allergies  DISCHARGE MEDICATIONS:   Allergies as of 06/08/2017   No Known Allergies     Medication List    TAKE these medications   cephALEXin 500 MG capsule Commonly known as:  KEFLEX Take 1 capsule (500 mg total) by mouth 3 (three) times daily for 7 days.   lisinopril-hydrochlorothiazide 10-12.5 MG tablet Commonly known as:  PRINZIDE,ZESTORETIC Take 1 tablet by mouth daily.   MIRENA (52 MG) 20 MCG/24HR IUD Generic drug:  levonorgestrel 1 each by Intrauterine route once.   sertraline 50 MG tablet Commonly known as:  ZOLOFT Take 1 tablet by mouth  daily.         Today   CHIEF COMPLAINT:   No acute events overnight.  Patient continues to have right arm swelling however is able to need the arm and denies pain.   VITAL SIGNS:  Blood pressure 122/68, pulse 60, temperature 98.5 F (36.9 C), temperature source Oral, resp. rate 16, height 5\' 7"  (1.702 m), weight 119.9 kg (264 lb 4.8 oz), SpO2 99 %.   REVIEW OF SYSTEMS:  Review of Systems  Constitutional: Negative.  Negative for chills, fever and malaise/fatigue.  HENT: Negative.  Negative for ear discharge, ear pain, hearing loss, nosebleeds and sore throat.   Eyes: Negative.  Negative for blurred vision and pain.  Respiratory: Negative.  Negative for cough, hemoptysis, shortness of breath and wheezing.   Cardiovascular: Negative.  Negative for chest pain, palpitations and leg swelling.  Gastrointestinal: Negative.  Negative for abdominal pain, blood in stool, diarrhea, nausea and vomiting.  Genitourinary: Negative.  Negative for dysuria.  Musculoskeletal: Negative for back pain.       Right arm swelling  Skin: Negative.   Neurological: Negative for dizziness, tremors, speech change, focal weakness, seizures and headaches.  Endo/Heme/Allergies: Negative.  Does not bruise/bleed easily.  Psychiatric/Behavioral: Negative.  Negative for depression, hallucinations and suicidal ideas.     PHYSICAL EXAMINATION:  GENERAL:  37 y.o.-year-old patient lying in the bed with no acute distress.  NECK:  Supple, no jugular venous distention. No thyroid enlargement, no tenderness.  LUNGS: Normal breath sounds bilaterally, no wheezing, rales,rhonchi  No use of accessory muscles of respiration.  CARDIOVASCULAR: S1, S2 normal. No murmurs, rubs, or gallops.  ABDOMEN: Soft, non-tender, non-distended. Bowel sounds present. No organomegaly or mass.  EXTREMITIES: No pedal edema, cyanosis, or clubbing. Right arm swelling  PSYCHIATRIC: The patient is alert and oriented x 3.  SKIN: No obvious rash,  lesion, or ulcer.   DATA REVIEW:   CBC Recent Labs  Lab 06/07/17 2339  WBC 11.3*  HGB 13.6  HCT 38.6  PLT 264    Chemistries  Recent Labs  Lab 06/07/17 2339 06/08/17 0659  NA 136  --   K 3.0*  --   CL 101  --   CO2 28  --   GLUCOSE 113*  --   BUN 15  --   CREATININE 0.73  --   CALCIUM 8.4*  --   MG  --  1.9    Cardiac Enzymes No results for input(s): TROPONINI in the last 168 hours.  Microbiology Results  @[id number] @  RADIOLOGY:  Mr Humerus Right Wo Contrast  Result Date: 06/08/2017 CLINICAL DATA:  Onset of right arm pain and swelling last night which began after working in the garden yesterday. The patient may have suffered an insect bite. EXAM: MRI OF THE RIGHT HUMERUS WITHOUT CONTRAST TECHNIQUE: Multiplanar, multisequence MR imaging of the right humerus was performed. No intravenous contrast was administered. COMPARISON:  None. FINDINGS: Bones/Joint/Cartilage Marrow signal is normal throughout without fracture, stress change or focal lesion. Negative for osteomyelitis. Ligaments Negative. Muscles and Tendons There is edema throughout the lateral head of the triceps. No tear is identified. Musculature otherwise appears normal. Soft tissues Extensive subcutaneous edema is present about the upper arm. No focal fluid collection. IMPRESSION: Edema throughout the lateral head of the triceps and the subcutaneous tissues of the left upper arm could be due to infectious or inflammatory process. Rhabdomyolysis in the lateral head of the triceps is also within the differential. Negative for abscess or osteomyelitis. Electronically Signed   By: Drusilla Kanner M.D.   On: 06/08/2017 12:06   US Venous Img Upper Uni Right  Result Date: 06/07/2017 CLINICAL DATA:  Swelling since last night EXAM: RIGHT UPPER EXTREMITY VENOUS DOPPLER ULTRASOUND TECHNIQUE: Gray-scale sonography with graded compression, as well as color Doppler and duplex ultrasound were performed to evaluate the upper  extremity deep venous system from the level of the subclavian vein and including the jugular, axillary, basilic and upper cephalic vein. Spectral Doppler was utilized to evaluate flow at rest and with distal augmentation maneuvers. COMPARISON:  None. FINDINGS: Thrombus within deep veins:  None visualized. Compressibility of deep veins:  Normal. Duplex waveform respiratory phasicity:  Normal. Duplex waveform response to augmentation:  Normal. Venous reflux:  None visualized. Other findings: Limited images of the contralateral left subclavian vein unremarkable. IMPRESSION: 1. Negative for right upper extremity DVT. Electronically Signed   By: Corlis Leak M.D.   On: 06/07/2017 14:53      Allergies as of 06/08/2017   No Known Allergies     Medication List    TAKE these medications   cephALEXin 500 MG capsule Commonly known as:  KEFLEX Take 1 capsule (500 mg total) by mouth 3 (three) times daily for 7 days.   lisinopril-hydrochlorothiazide 10-12.5 MG tablet Commonly known as:  PRINZIDE,ZESTORETIC Take 1 tablet by mouth daily.   MIRENA (52 MG) 20 MCG/24HR IUD Generic drug:  levonorgestrel 1 each by Intrauterine route once.   sertraline  50 MG tablet Commonly known as:  ZOLOFT Take 1 tablet by mouth daily.          Management plans discussed with the patient and shee is in agreement. Stable for discharge home  Patient should follow up with ortho  CODE STATUS:     Code Status Orders  (From admission, onward)        Start     Ordered   06/07/17 1727  Full code  Continuous     06/07/17 1728    Code Status History    This patient has a current code status but no historical code status.      TOTAL TIME TAKING CARE OF THIS PATIENT: 38 minutes.    Note: This dictation was prepared with Dragon dictation along with smaller phrase technology. Any transcriptional errors that result from this process are unintentional.  Vivi Piccirilli M.D on 06/08/2017 at 12:16 PM  Between 7am to  6pm - Pager - [phone] After 6pm go to [email or web address] - Social research officer, government  [facility]  Office  [phone]  CC: Primary care physician; [facility]

## 2017-06-08 NOTE — Progress Notes (Signed)
PHARMACIST - PHYSICIAN COMMUNICATION  CONCERNING:  Enoxaparin (Lovenox) for DVT Prophylaxis    RECOMMENDATION: Patient was prescribed enoxaprin 40mg  q24 hours for VTE prophylaxis.   Filed Weights   06/07/17 1321 06/07/17 2136  Weight: 262 lb (118.8 kg) 264 lb 4.8 oz (119.9 kg)    Body mass index is 41.4 kg/m.  Estimated Creatinine Clearance: 130.3 mL/min (by C-G formula based on SCr of 0.73 mg/dL).  Based on [facility] policy patient is candidate for enoxaparin 40mg  every 12 hour dosing due to BMI being >40.  DESCRIPTION: Pharmacy has adjusted enoxaparin dose per [facility] policy, approved through [employer] committee.  Patient is now receiving enoxaparin 40mg  every 12 hours.   Cher NakaiSheema Jillyan Plitt, PharmD, BCPS Clinical Pharmacist  06/08/2017 7:51 AM

## 2017-06-08 NOTE — Progress Notes (Signed)
Pt MRI results reviewed by MD, cleared for discharge. Discharge information, appointments made, and first dose information given to patient. 1 prescription given to patient. IV removed. VS stable. A&Ox4. Mother to transport patient home. Will continue to monitor until discharge.  Kinnie ScalesKim Saim Almanza, LPN

## 2017-06-09 LAB — HIV ANTIBODY (ROUTINE TESTING W REFLEX): HIV Screen 4th Generation wRfx: NONREACTIVE

## 2017-09-08 ENCOUNTER — Ambulatory Visit (INDEPENDENT_AMBULATORY_CARE_PROVIDER_SITE_OTHER): Payer: BC Managed Care – PPO | Admitting: Advanced Practice Midwife

## 2017-09-08 ENCOUNTER — Encounter: Payer: Self-pay | Admitting: Advanced Practice Midwife

## 2017-09-08 ENCOUNTER — Other Ambulatory Visit (HOSPITAL_COMMUNITY)
Admission: RE | Admit: 2017-09-08 | Discharge: 2017-09-08 | Disposition: A | Discharge status: Home / Self Care | Payer: BC Managed Care – PPO | Source: Ambulatory Visit | Attending: Advanced Practice Midwife | Admitting: Advanced Practice Midwife

## 2017-09-08 VITALS — BP 114/70 | HR 100 | Ht 67.0 in | Wt 256.0 lb

## 2017-09-08 DIAGNOSIS — Z113 Encounter for screening for infections with a predominantly sexual mode of transmission: Secondary | ICD-10-CM | POA: Diagnosis present

## 2017-09-08 DIAGNOSIS — N898 Other specified noninflammatory disorders of vagina: Secondary | ICD-10-CM | POA: Insufficient documentation

## 2017-09-08 DIAGNOSIS — R896 Abnormal cytological findings in specimens from other organs, systems and tissues: Secondary | ICD-10-CM | POA: Insufficient documentation

## 2017-09-08 NOTE — Progress Notes (Signed)
Patient ID: Carla Watson, female   DOB: 1980/05/02, 37 y.o.   MRN: [id number]  Reason for Consult: Vaginal irritation (Discharge w/o odor)     Subjective:     HPI:  Carla Watson is a 37 y.o. female in the office for STD testing. She was last sexually active in March of this year. She did not use condoms when she was sexually active. A few weeks ago she noticed some yellow discharge and then today she noticed it again as yellow/brown. She denies any odor, irritation, itching. She has an IUD for birth control. She does not have periods. She is requesting STD testing today just to make sure. Discussed most likely source of discharge is related to IUD/hormones given her history. She has no other concerns or questions today.  Past Medical History:  Diagnosis Date  . Acne   . Anxiety 2015  . Backache   . Hypertension   . Mood disorder (HCC)   . Obesity   . PCOS (polycystic ovarian syndrome)    Family History  Problem Relation Age of Onset  . Hypertension Father   . COPD Maternal Grandmother   . Brain cancer Paternal Grandfather   . Lung cancer Paternal Grandfather   . Hypertension Paternal Grandfather    Past Surgical History:  Procedure Laterality Date  . BACK SURGERY    . COLPOSCOPY W/ BIOPSY / CURETTAGE  2015   2016, 2017    Short Social History:  Social History   Tobacco Use  . Smoking status: Never Smoker  . Smokeless tobacco: Never Used  Substance Use Topics  . Alcohol use: Yes    Comment: rare    No Known Allergies  Current Outpatient Medications  Medication Sig Dispense Refill  . levonorgestrel (MIRENA, 52 MG,) 20 MCG/24HR IUD 1 each by Intrauterine route once.     Marland Kitchen lisinopril-hydrochlorothiazide (PRINZIDE,ZESTORETIC) 10-12.5 MG tablet Take 1 tablet by mouth daily.     . sertraline (ZOLOFT) 50 MG tablet Take 1 tablet by mouth daily.  2   No current facility-administered medications for this visit.     Review of Systems  Constitutional:   Constitutional negative. HENT: HENT negative.  Eyes: Eyes negative.  Respiratory: Respiratory negative.  Cardiovascular: Cardiovascular negative.  GI: Gastrointestinal negative.  GU:       Yellow/brown discharge Musculoskeletal: Musculoskeletal negative.  Skin: Skin negative.  Neurological: Neurological negative. Hematologic: Hematologic/lymphatic negative.  Psychiatric: Psychiatric negative.        Objective:  Objective   Vitals:   09/08/17 1459  BP: 114/70  Pulse: 100  Weight: 256 lb (116.1 kg)  Height: 5\' 7"  (1.702 m)   Body mass index is 40.1 kg/m.  Vital Signs: BP 114/70 (BP Location: Left Arm, Patient Position: Sitting, Cuff Size: Large)   Pulse 100   Ht 5\' 7"  (1.702 m)   Wt 256 lb (116.1 kg)   BMI 40.10 kg/m  Constitutional: Well nourished, well developed female in no acute distress.  HEENT: normal Skin: Warm and dry.  Cardiovascular: Regular rate and rhythm.   Respiratory: Clear to auscultation bilateral. Normal respiratory effort Psych: Alert and Oriented x3. No memory deficits. Normal mood and affect.    Pelvic exam:  is not limited by body habitus EGBUS: within normal limits Vagina: within normal limits and with normal mucosa, scant thin white discharge Cervix: normal appearance       Assessment/Plan:     37 yo G0P0 female in office for STD testing NuSwab aptima sent  to lab    Tresea MallJane Amali Uhls CNM  [facility], [facility]

## 2017-09-09 LAB — HIV ANTIBODY (ROUTINE TESTING W REFLEX): HIV Screen 4th Generation wRfx: NONREACTIVE

## 2017-09-09 LAB — HEPATITIS B SURFACE ANTIBODY,QUALITATIVE: Hep B Surface Ab, Qual: REACTIVE

## 2017-09-09 LAB — RPR QUALITATIVE: RPR Ser Ql: NONREACTIVE

## 2017-09-09 LAB — HEPATITIS C ANTIBODY: Hep C Virus Ab: <0.1 s/co ratio (ref 0.0–0.9)

## 2017-09-11 LAB — CERVICOVAGINAL ANCILLARY ONLY
Bacterial vaginitis: NEGATIVE
Candida vaginitis: POSITIVE — AB
Chlamydia: NEGATIVE
Neisseria Gonorrhea: NEGATIVE
Trichomonas: NEGATIVE

## 2017-10-24 ENCOUNTER — Ambulatory Visit: Payer: BC Managed Care – PPO | Admitting: Podiatry

## 2017-10-24 ENCOUNTER — Ambulatory Visit (INDEPENDENT_AMBULATORY_CARE_PROVIDER_SITE_OTHER): Payer: BC Managed Care – PPO

## 2017-10-24 ENCOUNTER — Encounter: Payer: Self-pay | Admitting: Podiatry

## 2017-10-24 VITALS — BP 115/66 | HR 74

## 2017-10-24 DIAGNOSIS — M722 Plantar fascial fibromatosis: Secondary | ICD-10-CM

## 2017-10-24 MED ORDER — NONFORMULARY OR COMPOUNDED ITEM: 2 refills | Status: DC

## 2017-10-25 NOTE — Progress Notes (Signed)
   Subjective: 37 year old female presenting today as a new patient with a chief complaint of pain to the plantar heel and mid arch of the right foot that began 3-4 months ago. She states she felt a popping sensation at that time and now has pain and associated swelling. She states the pain is worse in the morning, at night and after exercising. She has not done anything for treatment. Patient is here for further evaluation and treatment.   Past Medical History:  Diagnosis Date  . Acne   . Anxiety 2015  . Backache   . Hypertension   . Mood disorder (HCC)   . Obesity   . PCOS (polycystic ovarian syndrome)      Objective: Physical Exam General: The patient is alert and oriented x3 in no acute distress.  Dermatology: Skin is warm, dry and supple bilateral lower extremities. Negative for open lesions or macerations bilateral.   Vascular: Dorsalis Pedis and Posterior Tibial pulses palpable bilateral.  Capillary fill time is immediate to all digits.  Neurological: Epicritic and protective threshold intact bilateral.   Musculoskeletal: Tenderness to palpation to the plantar aspect of the right heel along the plantar fascia. All other joints range of motion within normal limits bilateral. Strength 5/5 in all groups bilateral.   Radiographic exam: Normal osseous mineralization. Joint spaces preserved. No fracture/dislocation/boney destruction. No other soft tissue abnormalities or radiopaque foreign bodies.   Assessment: 1. Plantar fasciitis right 2. Pain in right foot  Plan of Care:  1. Patient evaluated. Xrays reviewed.   2. Injection of 0.5cc Celestone soluspan injected into the right plantar fascia  3. Rx for Medrol Dose pack placed 4. Rx for pain cream to be dispensed by [facility] pharmacy.  4. Plantar fascial band(s) dispensed 5. Instructed patient regarding therapies and modalities at home to alleviate symptoms.  6. Patient has a h/o rhabdomyolysis and is hesitant to take oral  medications. Declined oral NSAIDs for now.  7. Return to clinic in 4 weeks.    Sister-in-law is Horticulturist, commercial ([place]) Laveda Abbe, ED doctor. 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    Felecia Shelling, DPM [facility]  Dr. Felecia Shelling, DPM    [address] [place], [place] [address]                Office [phone]  Fax [phone]

## 2017-10-26 ENCOUNTER — Telehealth: Payer: Self-pay | Admitting: Podiatry

## 2017-10-26 MED ORDER — METHYLPREDNISOLONE 4 MG PO TABS: ORAL_TABLET | ORAL | 0 refills | Status: DC

## 2017-10-26 NOTE — Telephone Encounter (Signed)
I saw Dr. Logan BoresEvans on Tuesday and I think he mentioned he was going to call in a Rx for prednisone for me. [facility] [address] [facility] [address] I never received any notification from the pharmacy so I am not sure if you guys took care of that or not, or if he decided against it. I [facility] get [facility] from [facility]. When you get a chance, please call me at [phone] to let me know about the Rx for prednisone. Thank you.

## 2017-10-26 NOTE — Telephone Encounter (Signed)
Called patient and informed via voice mail  that Medrol dose pack has been sent to her pharmacy and appoligized for the delay.

## 2017-11-21 ENCOUNTER — Ambulatory Visit: Payer: BC Managed Care – PPO | Admitting: Podiatry

## 2017-11-21 DIAGNOSIS — M722 Plantar fascial fibromatosis: Secondary | ICD-10-CM | POA: Diagnosis not present

## 2017-11-22 NOTE — Progress Notes (Signed)
   Subjective: 37 year old female presenting today for follow up evaluation of plantar fasciitis of the right foot. She states her pain has improved significantly and is almost gone completely. She has been using the plantar fascial brace as directed for treatment. There are no modifying factors noted. Patient is here for further evaluation and treatment.   Past Medical History:  Diagnosis Date  . Acne   . Anxiety 2015  . Backache   . Hypertension   . Mood disorder (HCC)   . Obesity   . PCOS (polycystic ovarian syndrome)      Objective: Physical Exam General: The patient is alert and oriented x3 in no acute distress.  Dermatology: Skin is warm, dry and supple bilateral lower extremities. Negative for open lesions or macerations bilateral.   Vascular: Dorsalis Pedis and Posterior Tibial pulses palpable bilateral.  Capillary fill time is immediate to all digits.  Neurological: Epicritic and protective threshold intact bilateral.   Musculoskeletal: Tenderness to palpation to the plantar aspect of the right heel along the plantar fascia. All other joints range of motion within normal limits bilateral. Strength 5/5 in all groups bilateral.    Assessment: 1. Plantar fasciitis right - improved   Plan of Care:  1. Patient evaluated.    2. Injection of 0.5cc Celestone soluspan injected into the right plantar fascia  3. Continue using plantar fascial brace.  4. Appointment with Raiford Noble for custom molded orthotics.  5. Return to clinic as needed.    Sister-in-law is Horticulturist, commercial ([place]) Laveda Abbe, ED doctor. 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    Felecia Shelling, DPM [facility]  Dr. Felecia Shelling, DPM    [address] [place], [place] [address]                Office [phone]  Fax [phone]

## 2018-01-10 ENCOUNTER — Other Ambulatory Visit: Payer: BC Managed Care – PPO | Admitting: Orthotics

## 2018-08-27 ENCOUNTER — Ambulatory Visit (INDEPENDENT_AMBULATORY_CARE_PROVIDER_SITE_OTHER): Payer: BC Managed Care – PPO | Admitting: Obstetrics and Gynecology

## 2018-08-27 ENCOUNTER — Other Ambulatory Visit (HOSPITAL_COMMUNITY)
Admission: RE | Admit: 2018-08-27 | Discharge: 2018-08-27 | Disposition: A | Discharge status: Home / Self Care | Payer: BC Managed Care – PPO | Source: Ambulatory Visit | Attending: Obstetrics and Gynecology | Admitting: Obstetrics and Gynecology

## 2018-08-27 ENCOUNTER — Encounter: Payer: Self-pay | Admitting: Obstetrics and Gynecology

## 2018-08-27 ENCOUNTER — Other Ambulatory Visit: Payer: Self-pay

## 2018-08-27 VITALS — BP 122/70 | HR 86 | Ht 67.0 in | Wt 264.0 lb

## 2018-08-27 DIAGNOSIS — Z01419 Encounter for gynecological examination (general) (routine) without abnormal findings: Secondary | ICD-10-CM

## 2018-08-27 DIAGNOSIS — Z124 Encounter for screening for malignant neoplasm of cervix: Secondary | ICD-10-CM | POA: Insufficient documentation

## 2018-08-27 DIAGNOSIS — Z3049 Encounter for surveillance of other contraceptives: Secondary | ICD-10-CM

## 2018-08-27 DIAGNOSIS — Z1239 Encounter for other screening for malignant neoplasm of breast: Secondary | ICD-10-CM

## 2018-08-27 NOTE — Progress Notes (Signed)
Gynecology Annual Exam   [place]P: [place], [facility]  Chief Complaint:  Chief Complaint  Patient presents with  . Gynecologic Exam    History of Present Illness: Patient is a 38 y.o. G0P0000 presents for annual exam. The patient has no complaints today.   LMP: No LMP recorded. (Menstrual status: IUD). Amenorrhea on IUD  The patient does perform self breast exams.  There is no notable family history of breast or ovarian cancer in her family.  The patient wears seatbelts: yes.   The patient has regular exercise: not asked.    The patient denies current symptoms of depression.    Review of Systems: Review of Systems  Constitutional: Negative for chills and fever.  HENT: Negative for congestion.   Respiratory: Negative for cough and shortness of breath.   Cardiovascular: Negative for chest pain and palpitations.  Gastrointestinal: Negative for abdominal pain, constipation, diarrhea, heartburn, nausea and vomiting.  Genitourinary: Negative for dysuria, frequency and urgency.  Skin: Negative for itching and rash.  Neurological: Negative for dizziness and headaches.  Endo/Heme/Allergies: Negative for polydipsia.  Psychiatric/Behavioral: Negative for depression.    Past Medical History:  Past Medical History:  Diagnosis Date  . Acne   . Anxiety 2015  . Backache   . Hypertension   . Mood disorder (HCC)   . Obesity   . PCOS (polycystic ovarian syndrome)     Past Surgical History:  Past Surgical History:  Procedure Laterality Date  . BACK SURGERY    . COLPOSCOPY W/ BIOPSY / CURETTAGE  2015   2016, 2017    Gynecologic History:  No LMP recorded. (Menstrual status: IUD). Contraception:Mirena IUD 09/26/2014 Last Pap: Results were: 03/26/2015 low-grade squamous intraepithelial neoplasia (LGSIL - encompassing HPV,mild dysplasia,CIN I) HPV positive, no significant changes biopsies read as superficial squamous cells non-diagnostic (04/14/2014 colposcopy showing CIN I)  03/27/2017 LGSIL HPV positive  Colposcopy 04/18/2017 chronic cervicitis  Obstetric History: G0P0000  Family History:  Family History  Problem Relation Age of Onset  . Hypertension Father   . COPD Maternal Grandmother   . Brain cancer Paternal Grandfather   . Lung cancer Paternal Grandfather   . Hypertension Paternal Grandfather     Social History:  Social History   Socioeconomic History  . Marital status: Single    Spouse name: Not on file  . Number of children: Not on file  . Years of education: Not on file  . Highest education level: Not on file  Occupational History  . Occupation: high Engineer, siteschool teacher  Social Needs  . Financial resource strain: Not on file  . Food insecurity    Worry: Not on file    Inability: Not on file  . Transportation needs    Medical: Not on file    Non-medical: Not on file  Tobacco Use  . Smoking status: Never Smoker  . Smokeless tobacco: Never Used  Substance and Sexual Activity  . Alcohol use: Yes    Comment: rare  . Drug use: No  . Sexual activity: Yes    Partners: Male    Birth control/protection: I.U.D.  Lifestyle  . Physical activity    Days per week: 5 days    Minutes per session: 30 min  . Stress: To some extent  Relationships  . Social connections    Talks on phone: More than three times a week    Gets together: Once a week    Attends religious service: 1 to 4 times per year  Active member of club or organization: Yes    Attends meetings of clubs or organizations: More than 4 times per year    Relationship status: Never married  . Intimate partner violence    Fear of current or ex partner: No    Emotionally abused: No    Physically abused: No    Forced sexual activity: No  Other Topics Concern  . Not on file  Social History Narrative  . Not on file    Allergies:  Allergies  Allergen Reactions  . Pertussis Vaccines Other (See Comments)    Seizure Disorder   . Tape Rash    Medications: Prior to Admission  medications   Medication Sig Start Date End Date Taking? Authorizing Provider  levonorgestrel (MIRENA, 52 MG,) 20 MCG/24HR IUD 1 each by Intrauterine route once.    Yes [provider]  sertraline (ZOLOFT) 50 MG tablet Take 1 tablet by mouth daily. 05/22/17  Yes [provider]  lisinopril-hydrochlorothiazide (PRINZIDE,ZESTORETIC) 10-12.5 MG tablet Take 1 tablet by mouth daily.  10/26/16 10/26/17  [provider]    Physical Exam Vitals: Blood pressure 122/70, pulse 86, height 5\' 7"  (1.702 m), weight 264 lb (119.7 kg).  General: NAD HEENT: normocephalic, anicteric Thyroid: no enlargement, no palpable nodules Pulmonary: No increased work of breathing, CTAB Cardiovascular: RRR, distal pulses 2+ Breast: Breast symmetrical, no tenderness, no palpable nodules or masses, no skin or nipple retraction present, no nipple discharge.  No axillary or supraclavicular lymphadenopathy. Abdomen: NABS, soft, non-tender, non-distended.  Umbilicus without lesions.  No hepatomegaly, splenomegaly or masses palpable. No evidence of hernia  Genitourinary:  External: Normal external female genitalia.  Normal urethral meatus, normal Bartholin's and Skene's glands.    Vagina: Normal vaginal mucosa, no evidence of prolapse.    Cervix: Grossly normal in appearance, no bleeding, IUD string visualized 2cm  Uterus: Non-enlarged, mobile, normal contour.  No CMT  Adnexa: ovaries non-enlarged, no adnexal masses  Rectal: deferred  Lymphatic: no evidence of inguinal lymphadenopathy Extremities: no edema, erythema, or tenderness Neurologic: Grossly intact Psychiatric: mood appropriate, affect full  Female chaperone present for pelvic and breast  portions of the physical exam      Assessment: 38 y.o. G0P0000 routine annual exam  Plan: Problem List Items Addressed This Visit    None    Visit Diagnoses    Encounter for gynecological examination without abnormal finding    -  Primary    Screening for malignant neoplasm of cervix       Relevant Orders   Cytology - PAP   Breast screening       Encounter for surveillance of other contraceptive          1) STI screening  was notoffered and therefore not obtained  2)  ASCCP guidelines and rational discussed.  Patient opts for yearly screening interval  3) Contraception - the patient is currently using  IUD.  She is happy with her current form of contraception and plans to continue  4) Routine healthcare maintenance including cholesterol, diabetes screening discussed managed by PCP  5) Return in about 1 year (around 08/27/2019) for annual.   Malachy Mood, MD, [place], [facility] 08/27/2018, 2:02 PM

## 2018-08-29 LAB — CYTOLOGY - PAP
Diagnosis: NEGATIVE
HPV: NOT DETECTED

## 2018-09-25 ENCOUNTER — Ambulatory Visit (INDEPENDENT_AMBULATORY_CARE_PROVIDER_SITE_OTHER): Payer: BC Managed Care – PPO

## 2018-09-25 ENCOUNTER — Encounter: Payer: Self-pay | Admitting: Podiatry

## 2018-09-25 ENCOUNTER — Ambulatory Visit: Payer: BC Managed Care – PPO | Admitting: Podiatry

## 2018-09-25 ENCOUNTER — Other Ambulatory Visit: Payer: Self-pay

## 2018-09-25 VITALS — Temp 97.3°F

## 2018-09-25 DIAGNOSIS — M722 Plantar fascial fibromatosis: Secondary | ICD-10-CM | POA: Diagnosis not present

## 2018-09-25 MED ORDER — MELOXICAM 15 MG PO TABS: 15.0000 mg | ORAL_TABLET | Freq: Every day | ORAL | 1 refills | Status: DC

## 2018-09-25 MED ORDER — METHYLPREDNISOLONE 4 MG PO TBPK: ORAL_TABLET | ORAL | 0 refills | Status: DC

## 2018-09-26 NOTE — Progress Notes (Signed)
   Subjective: 38 y.o. female presenting today with a chief complaint of burning pain in the arch, left heel and lateral left foot that began about two months ago. She states it feels like she is walking on a bruise. The pain is worse in the morning when she first gets out of bed. Walking increases the pain throughout the day. She has been icing, elevating and stretching the foot as well as wearing new shoes and taking Ibuprofen for treatment. Patient is here for further evaluation and treatment.   Past Medical History:  Diagnosis Date  . Acne   . Anxiety 2015  . Backache   . Hypertension   . Mood disorder ([place])   . Obesity   . PCOS (polycystic ovarian syndrome)      Objective: Physical Exam General: The patient is alert and oriented x3 in no acute distress.  Dermatology: Skin is warm, dry and supple bilateral lower extremities. Negative for open lesions or macerations bilateral.   Vascular: Dorsalis Pedis and Posterior Tibial pulses palpable bilateral.  Capillary fill time is immediate to all digits.  Neurological: Epicritic and protective threshold intact bilateral.   Musculoskeletal: Tenderness to palpation to the plantar aspect of the left heel along the plantar fascia. All other joints range of motion within normal limits bilateral. Strength 5/5 in all groups bilateral.   Radiographic exam: Normal osseous mineralization. Joint spaces preserved. No fracture/dislocation/boney destruction. No other soft tissue abnormalities or radiopaque foreign bodies.   Assessment: 1. Plantar fasciitis left foot  Plan of Care:  1. Patient evaluated. Xrays reviewed.   2. Injection of 0.5cc Celestone soluspan injected into the left plantar fascia.  3. Rx for Medrol Dose Pak placed 4. Rx for Meloxicam ordered for patient. 5. Plantar fascial band(s) dispensed  6. Instructed patient regarding therapies and modalities at home to alleviate symptoms.  7. Return to clinic in 4 weeks.     Sister-in-law is Erasmo Downer ([place]) [place] Ropes, ED physician. She is a high school Psychologist, prison and probation services at [employer].   Edrick Kins, DPM [facility]  Dr. Edrick Kins, DPM    [address], [place] [address]                Office [phone]  Fax [phone]

## 2018-10-23 ENCOUNTER — Ambulatory Visit: Payer: BC Managed Care – PPO | Admitting: Podiatry

## 2018-10-23 ENCOUNTER — Encounter: Payer: Self-pay | Admitting: Podiatry

## 2018-10-23 ENCOUNTER — Other Ambulatory Visit: Payer: Self-pay

## 2018-10-23 DIAGNOSIS — M722 Plantar fascial fibromatosis: Secondary | ICD-10-CM

## 2018-10-25 NOTE — Progress Notes (Signed)
   Subjective: 38 y.o. female presenting today for follow up evaluation of plantar fasciitis of the left foot. She states her pain has improved. She notes some mild pain at the end of the day after being on her feet. She has been using OTC insoles as directed. Patient is here for further evaluation and treatment.   Past Medical History:  Diagnosis Date  . Acne   . Anxiety 2015  . Backache   . Hypertension   . Mood disorder ([place])   . Obesity   . PCOS (polycystic ovarian syndrome)      Objective: Physical Exam General: The patient is alert and oriented x3 in no acute distress.  Dermatology: Skin is warm, dry and supple bilateral lower extremities. Negative for open lesions or macerations bilateral.   Vascular: Dorsalis Pedis and Posterior Tibial pulses palpable bilateral.  Capillary fill time is immediate to all digits.  Neurological: Epicritic and protective threshold intact bilateral.   Musculoskeletal: Tenderness to palpation to the plantar aspect of the left heel along the plantar fascia.  Limited ROM with ankle joint dorsiflexion. Positive Silfverskiold test. All other joints range of motion within normal limits bilateral. Strength 5/5 in all groups bilateral.   Assessment: 1. Plantar fasciitis left foot 2. H/o plantar fasciitis right 3. Gastrocnemius equinus bilateral   Plan of Care:  1. Patient evaluated.  2. Take previously prescribed Medrol Dose Pak as instructed.  3. Appointment with Liliane Channel, Pedorthist, for custom molded orthotics. Patient never made an appointment with Liliane Channel.  4. Continue using OTC insoles and good shoe gear.  5. Return to clinic as needed.    Sister-in-law is Erasmo Downer ([place]) [place] Ropes, ED physician. She is a high school Psychologist, prison and probation services at [employer].   Edrick Kins, DPM [facility]  Dr. Edrick Kins, DPM    [address], [place] [address]                Office [phone]  Fax  [phone]

## 2018-11-21 ENCOUNTER — Other Ambulatory Visit: Payer: BC Managed Care – PPO | Admitting: Orthotics

## 2019-03-25 IMAGING — US US EXTREM  UP VENOUS*R*
1 series · 14 of 24 positions shown · non-contrast
Comparison: None.

CLINICAL DATA: Swelling since last night

EXAM:
RIGHT UPPER EXTREMITY VENOUS DOPPLER ULTRASOUND
TECHNIQUE: Gray-scale sonography with graded compression, as well as color
Doppler and duplex ultrasound were performed to evaluate the upper
extremity deep venous system from the level of the subclavian vein
and including the jugular, axillary, basilic and upper cephalic
vein. Spectral Doppler was utilized to evaluate flow at rest and
with distal augmentation maneuvers.

[Series 1: us extrem up venous*right* · 0.08mm/px · 14 of 39 slices shown]
[im 1/39]
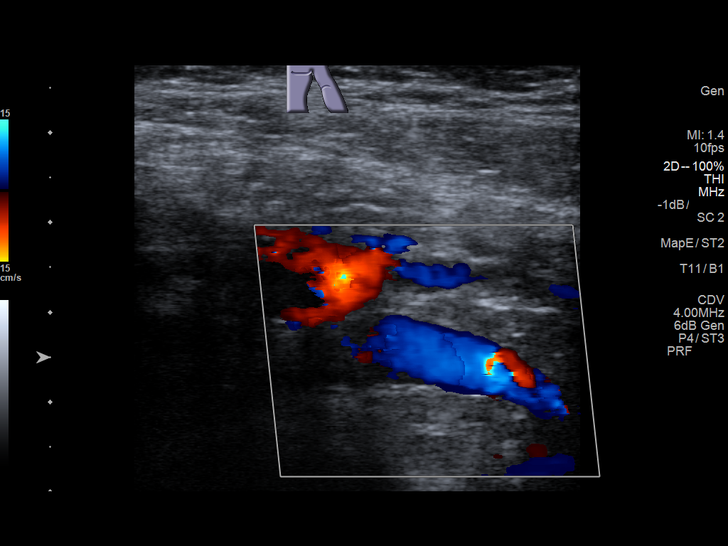
[im 4/39]
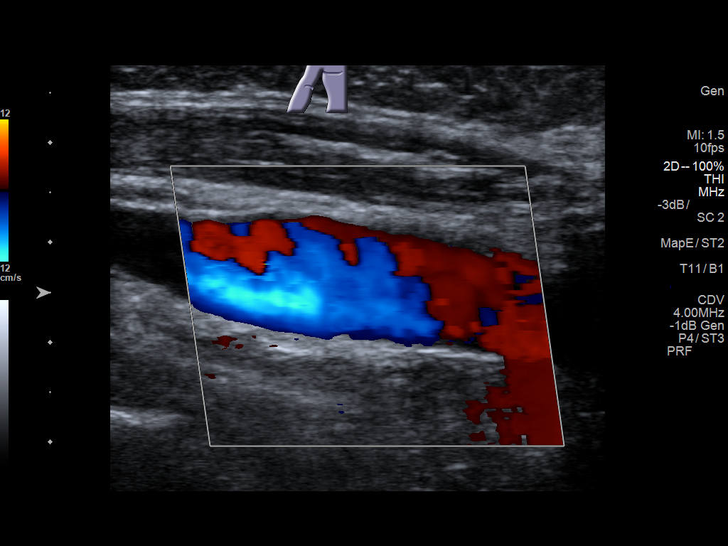
[im 7/39]
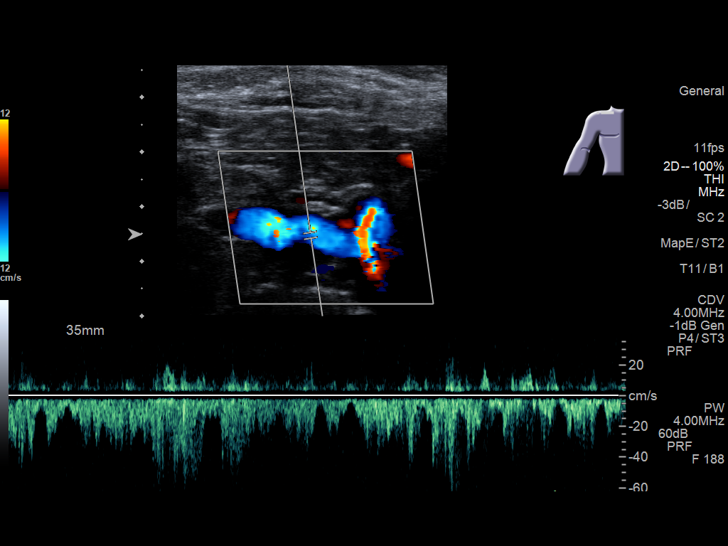
[im 10/39]
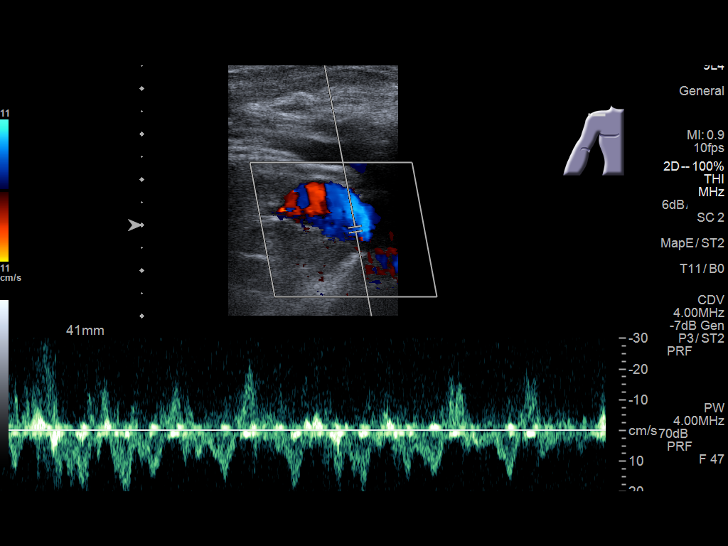
[im 12/39]
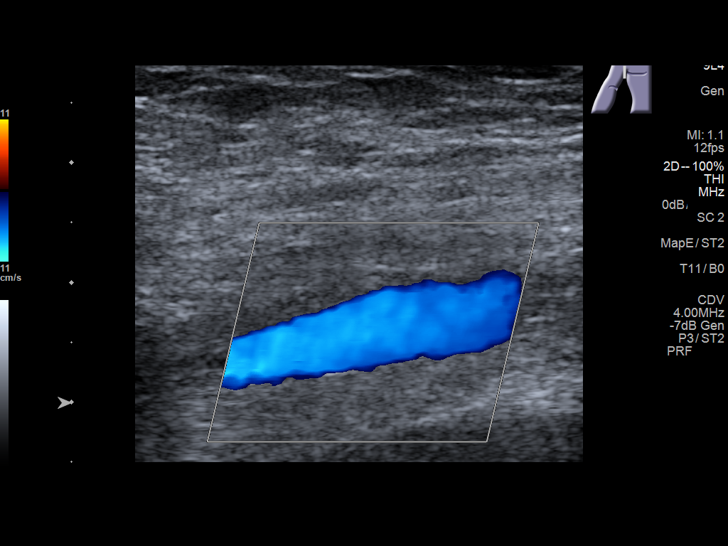
[im 15/39]
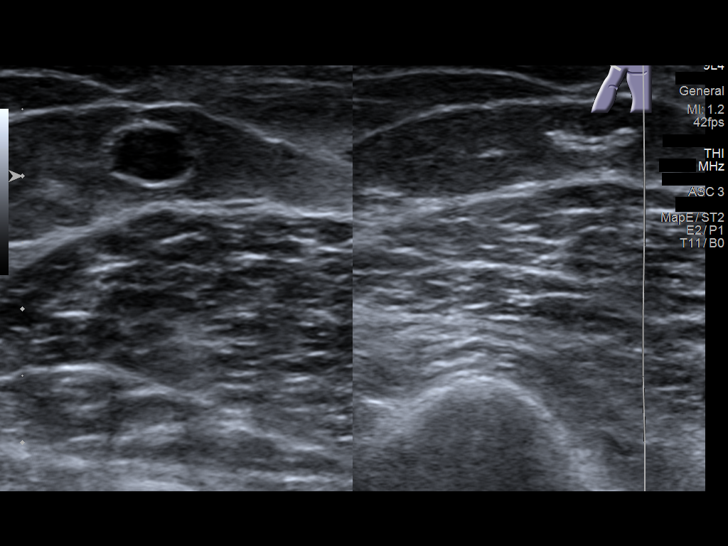
[im 19/39]
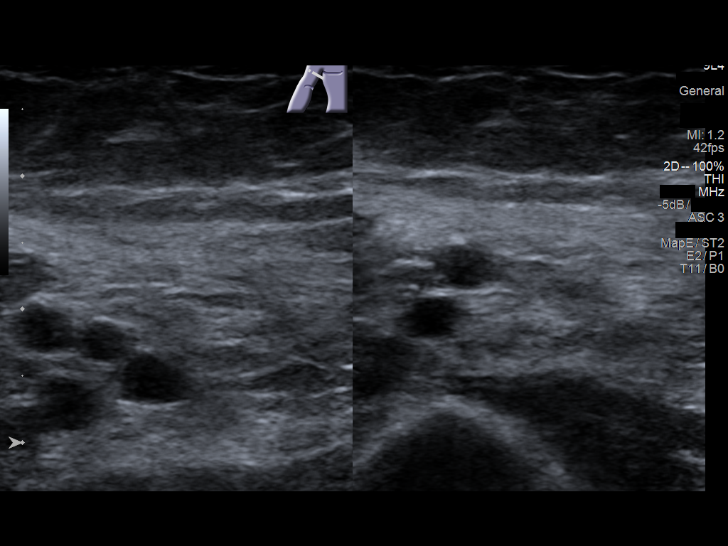
[im 20/39]
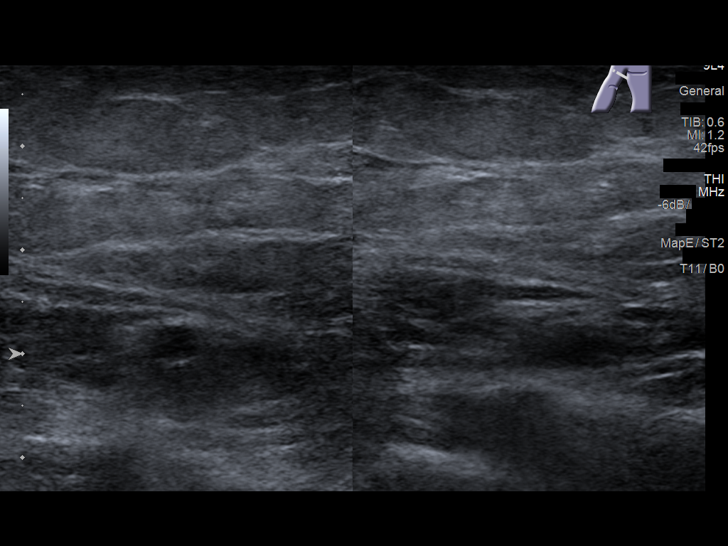
[im 24/39]
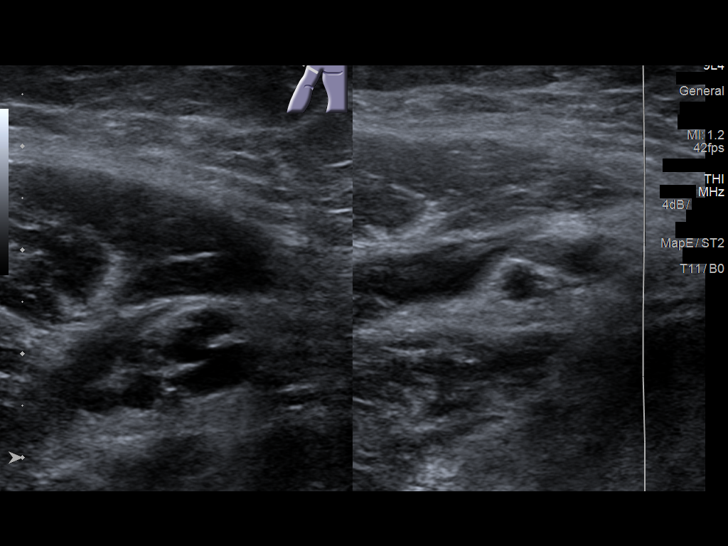
[im 27/39]
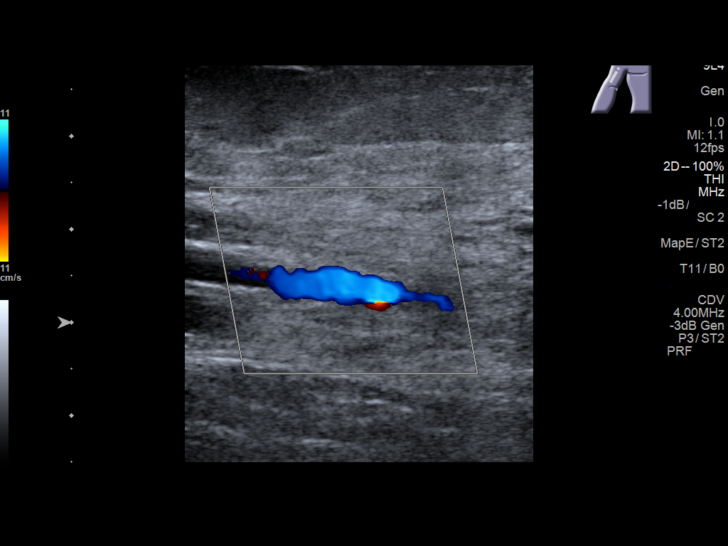
[im 30/39]
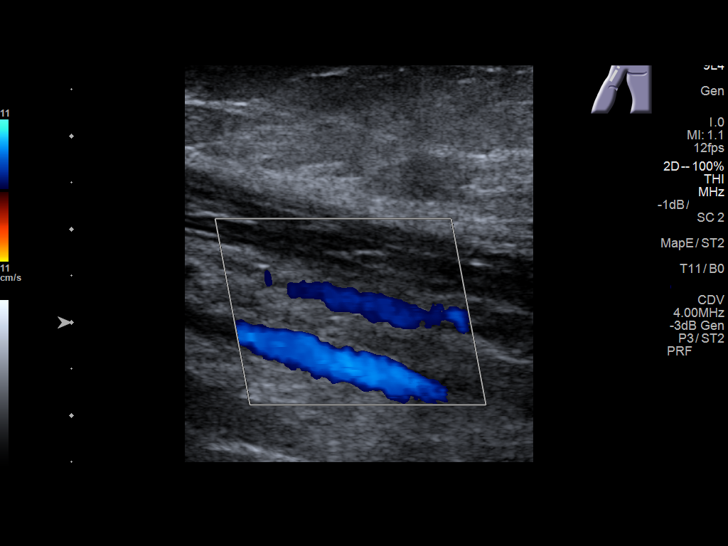
[im 32/39]
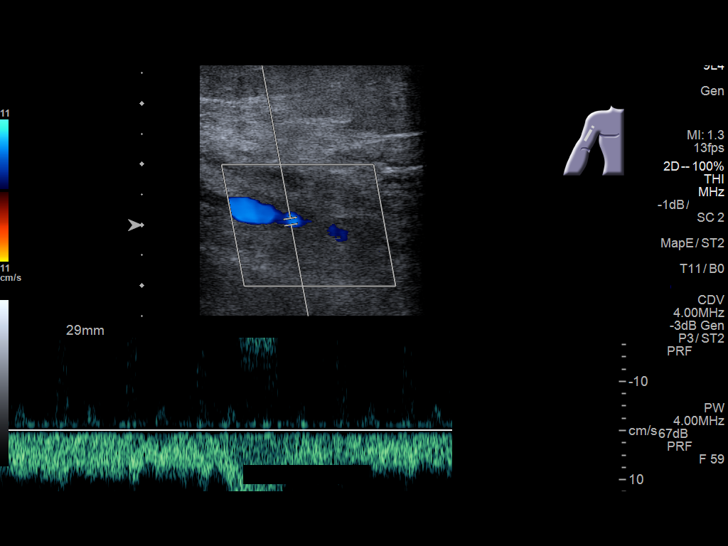
[im 35/39]
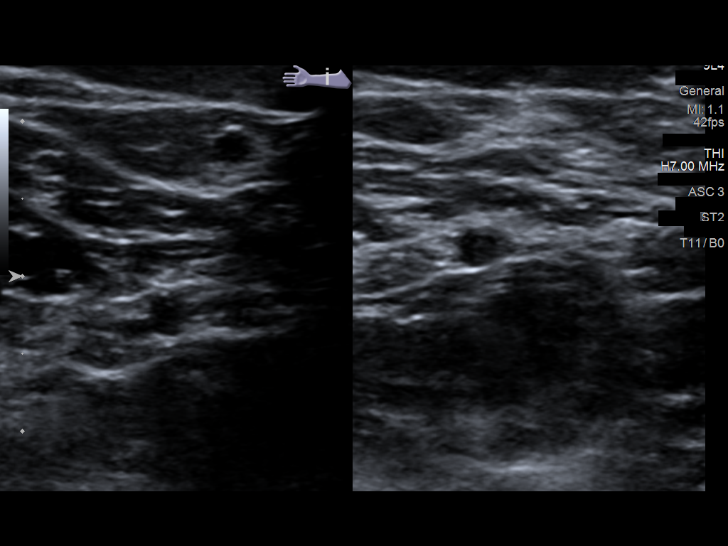
[im 39/39]
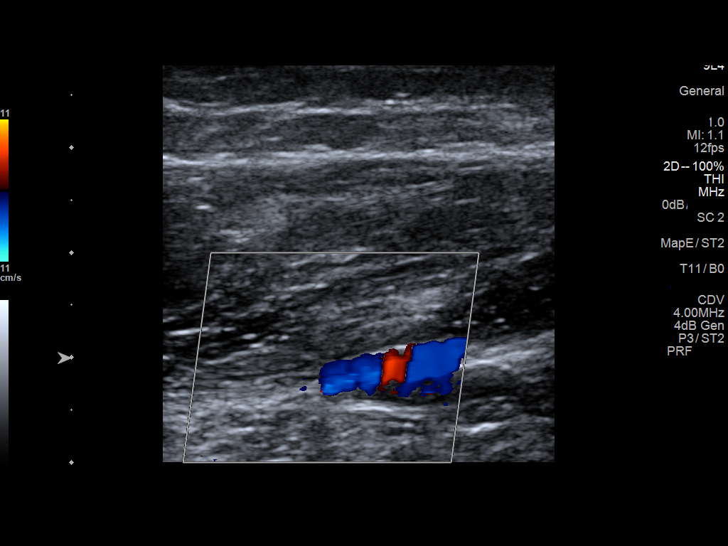

[14 of 24 positions shown; findings below may reference images not displayed]

FINDINGS: Thrombus within deep veins:  None visualized.

Compressibility of deep veins:  Normal.

Duplex waveform respiratory phasicity:  Normal.

Duplex waveform response to augmentation:  Normal.

Venous reflux:  None visualized.

Other findings: Limited images of the contralateral left subclavian
vein unremarkable.
IMPRESSION: 1. Negative for right upper extremity DVT.

## 2019-08-29 ENCOUNTER — Other Ambulatory Visit: Payer: Self-pay

## 2019-08-29 ENCOUNTER — Ambulatory Visit (INDEPENDENT_AMBULATORY_CARE_PROVIDER_SITE_OTHER): Payer: BC Managed Care – PPO | Admitting: Family Medicine

## 2019-08-29 ENCOUNTER — Encounter (INDEPENDENT_AMBULATORY_CARE_PROVIDER_SITE_OTHER): Payer: Self-pay | Admitting: Family Medicine

## 2019-08-29 VITALS — BP 132/76 | HR 62 | Temp 98.1°F | Ht 67.0 in | Wt 267.0 lb

## 2019-08-29 DIAGNOSIS — E785 Hyperlipidemia, unspecified: Secondary | ICD-10-CM | POA: Diagnosis not present

## 2019-08-29 DIAGNOSIS — Z0289 Encounter for other administrative examinations: Secondary | ICD-10-CM

## 2019-08-29 DIAGNOSIS — E559 Vitamin D deficiency, unspecified: Secondary | ICD-10-CM

## 2019-08-29 DIAGNOSIS — J45909 Unspecified asthma, uncomplicated: Secondary | ICD-10-CM

## 2019-08-29 DIAGNOSIS — Z1331 Encounter for screening for depression: Secondary | ICD-10-CM | POA: Diagnosis not present

## 2019-08-29 DIAGNOSIS — E282 Polycystic ovarian syndrome: Secondary | ICD-10-CM

## 2019-08-29 DIAGNOSIS — F419 Anxiety disorder, unspecified: Secondary | ICD-10-CM

## 2019-08-29 DIAGNOSIS — I10 Essential (primary) hypertension: Secondary | ICD-10-CM | POA: Diagnosis not present

## 2019-08-29 DIAGNOSIS — R5383 Other fatigue: Secondary | ICD-10-CM

## 2019-08-29 DIAGNOSIS — Z9189 Other specified personal risk factors, not elsewhere classified: Secondary | ICD-10-CM | POA: Diagnosis not present

## 2019-08-29 DIAGNOSIS — R0602 Shortness of breath: Secondary | ICD-10-CM | POA: Diagnosis not present

## 2019-08-29 DIAGNOSIS — Z6841 Body Mass Index (BMI) 40.0 and over, adult: Secondary | ICD-10-CM

## 2019-08-29 NOTE — Progress Notes (Signed)
Chief Complaint:   OBESITY Carla Watson (MR# [id number]) is a 39 y.o. female who presents for evaluation and treatment of obesity and related comorbidities. Current BMI is Body mass index is 41.82 kg/m. Carla Watson has been struggling with her weight for many years and has been unsuccessful in either losing weight, maintaining weight loss, or reaching her healthy weight goal.  Carla Watson is currently in the action stage of change and ready to dedicate time achieving and maintaining a healthier weight. Carla Watson is interested in becoming our patient and working on intensive lifestyle modifications including (but not limited to) diet and exercise for weight loss.  Carla Watson is a self-referral.  [facility] (sees them once a year).  She lives alone and has a OrthoptistBeagle named "Dotty".  She is a high Engineer, siteschool teacher and works full time.  "I've struggled with my weight ever since I was a child."  She does [facility] now 4-5 times per week for 45 minutes plus averages 7,000 steps per day on her fitness tracker.  She has a lot of guilt around eating/being overweight.  Positive emotional eating.  In the afternoons she craves foods like sweets, chips, Cheez-Its, and cookies (sweet or savory foods).  Her family eats unhealthy foods and a lot of sweets/desserts.  She eats out 5-10 times per week on average.  Carla Watson's habits were reviewed today and are as follows: Her family eats meals together, she thinks her family will eat healthier with her, her desired weight loss is 85 pounds, she has been heavy most of her life, she started gaining weight at 39 years old and after college, her heaviest weight ever was 295 pounds, she craves sweets, she frequently makes poor food choices, she frequently eats larger portions than normal and she struggles with emotional eating.  Depression Screen Carla Watson's Food and Mood (modified PHQ-9) score was 11.  Depression screen PHQ 2/9 08/29/2019  Decreased  Interest 1  Down, Depressed, Hopeless 2  PHQ - 2 Score 3  Altered sleeping 1  Tired, decreased energy 1  Change in appetite 2  Feeling bad or failure about yourself  2  Trouble concentrating 1  Moving slowly or fidgety/restless 0  Suicidal thoughts 1  PHQ-9 Score 11  Difficult doing work/chores Not difficult at all   Subjective:   1. Other fatigue Carla Watson admits to daytime somnolence and reports waking up still tired. Patent has a history of symptoms of daytime fatigue, morning fatigue and snoring. Carla Watson generally gets 6 or 7 hours of sleep per night, and states that she has generally restful sleep. Snoring is present. Apneic episodes are not present. Epworth Sleepiness Score is 7.  2. Shortness of breath on exertion Carla Watson notes increasing shortness of breath with exercising and seems to be worsening over time with weight gain. She notes getting out of breath sooner with activity than she used to. This has not gotten worse recently. Carla Watson denies shortness of breath at rest or orthopnea.  3. Essential hypertension Review: taking medications as instructed, no medication side effects noted, no chest pain on exertion, no dyspnea on exertion, no swelling of ankles.  Blood pressure is well-controlled today.  Well-controlled at past PCP/doctor's visits.  She has been taking lisinopril-HCTZ for 10+ years.  BP Readings from Last 3 Encounters:  08/29/19 132/76  08/27/18 122/70  10/24/17 115/66   4. Hyperlipidemia, unspecified hyperlipidemia type Carla Watson has hyperlipidemia and has been trying to improve her cholesterol levels with  intensive lifestyle modification including a low saturated fat diet, exercise and weight loss. She denies any chest pain, claudication or myalgias.  She has not been on medications for this in the past.  5. Reactive airway disease without complication, unspecified asthma severity, unspecified whether persistent Symptoms are stable.  She is not on any chronic  medications.  Symptoms seem to appear post exercise.  6. Vitamin D deficiency She is currently taking no vitamin D supplement.  She endorses fatigue and tiredness.  7. PCOS (polycystic ovarian syndrome) Documented diagnosis by her GYN since high school.  She has never been on medications for it.  She sees her GYN 1-2 or more times per year.  She has the Mirena IUD.  8. Anxiety, with emotional eating She is on sertraline.  She says she has been on and off medications for 6 or 7 years.  Denies SI.  9. Depression screening Carla Watson was screened for depression as part of her new patient workup.  PHQ-9 is 11.  10. At risk for diabetes mellitus Carla Watson is at higher than average risk for developing diabetes due to her obesity, history of PCOS, and positive family history of diabetes mellitus.   Assessment/Plan:   1. Other fatigue Carla Watson does feel that her weight is causing her energy to be lower than it should be. Fatigue may be related to obesity, depression or many other causes. Labs will be ordered, and in the meanwhile, Carla Watson will focus on self care including making healthy food choices, increasing physical activity and focusing on stress reduction. - EKG 12-Lead - CBC with Differential/Platelet - Vitamin B12 - Folate - T3, free - T4, free - TSH  2. Shortness of breath on exertion Carla Watson does not feel that she gets out of breath more easily that she used to when she exercises. Carla Watson's shortness of breath appears to be obesity related and exercise induced. She has agreed to work on weight loss and gradually increase exercise to treat her exercise induced shortness of breath. Will continue to monitor closely.  3. Essential hypertension Carla Watson is working on healthy weight loss and exercise to improve blood pressure control. We will watch for signs of hypotension as she continues her lifestyle modifications.  I recommend home blood pressure monitoring and checking once or twice per  month if well-controlled and at goal of below 120/80. - Comprehensive metabolic panel  4. Hyperlipidemia, unspecified hyperlipidemia type Cardiovascular risk and specific lipid/LDL goals reviewed.  We discussed several lifestyle modifications today and Carla Watson will continue to work on diet, exercise and weight loss efforts. Orders and follow up as documented in patient record.    Counseling Intensive lifestyle modifications are the first line treatment for this issue. . Dietary changes: Increase soluble fiber. Decrease simple carbohydrates. . Exercise changes: Moderate to vigorous-intensity aerobic activity 150 minutes per week if tolerated. . Lipid-lowering medications: see documented in medical record. - Lipid panel  5. Reactive airway disease without complication, unspecified asthma severity, unspecified whether persistent Follow-up with PCP regarding possible treatment for exercise-induced asthma and see if Singulair would be appropriate, etc.  Further treatment plan per PCP.  6. Vitamin D deficiency Low Vitamin D level contributes to fatigue and are associated with obesity, breast, and colon cancer.  Will check vitamin D level today. - VITAMIN D 25 Hydroxy (Vit-D Deficiency, Fractures)  7. PCOS (polycystic ovarian syndrome) Intensive lifestyle modifications are first line treatment for this issue. We discussed several lifestyle modifications today and she will continue to work on diet,  exercise and weight loss efforts.  Treatment plan per GYN.  Orders and follow up as documented in patient record.  Counseling . PCOS is a leading cause of menstrual irregularities and infertility. It is also associated with obesity, hirsutism (excessive hair growth on the face, chest, or back), and cardiovascular risk factors such as high cholesterol and insulin resistance. . Insulin resistance appears to play a central role.  . Women with PCOS have been shown to have impaired appetite-regulating  hormones. . Metformin is one medication that can improve metabolic parameters.  . Women with polycystic ovary syndrome (PCOS) have an increased risk for cardiovascular disease (CVD) - European Journal of Preventive Cardiology.  8. Anxiety, with emotional eating Patient was referred to Dr. Dewaine Conger, our Bariatric Psychologist, for evaluation due to her elevated PHQ-9 score and significant struggles with emotional eating.  9. Depression screening Carla Watson had a positive depression screening. Depression is commonly associated with obesity and often results in emotional eating behaviors. We will monitor this closely and work on CBT to help improve the non-hunger eating patterns.   10. At risk for diabetes mellitus Carla Watson was given approximately 15 minutes of diabetes education and counseling today. We discussed intensive lifestyle modifications today with an emphasis on weight loss as well as increasing exercise and decreasing simple carbohydrates in her diet. We also reviewed medication options with an emphasis on risk versus benefit of those discussed.   Repetitive spaced learning was employed today to elicit superior memory formation and behavioral change. - Hemoglobin A1c - Insulin, random  11. Class 3 severe obesity with serious comorbidity and body mass index (BMI) of 40.0 to 44.9 in adult, unspecified obesity type [facility]) Carla Watson is currently in the action stage of change and her goal is to continue with weight loss efforts. I recommend Carla Watson begin the structured treatment plan as follows:  She has agreed to the Category 2 Plan.  Exercise goals: As is.   Behavioral modification strategies: increasing lean protein intake, decreasing simple carbohydrates, increasing water intake, meal planning and cooking strategies and planning for success.  She was informed of the importance of frequent follow-up visits to maximize her success with intensive lifestyle modifications for her multiple health  conditions. She was informed we would discuss her lab results at her next visit unless there is a critical issue that needs to be addressed sooner. Carla Watson agreed to keep her next visit at the agreed upon time to discuss these results.  Objective:   Blood pressure 132/76, pulse 62, temperature 98.1 F (36.7 C), temperature source Oral, height 5\' 7"  (1.702 m), weight 267 lb (121.1 kg), last menstrual period 08/16/2019, SpO2 99 %. Body mass index is 41.82 kg/m.  EKG: Normal sinus rhythm, rate 70 bpm.  Indirect Calorimeter completed today shows a VO2 of 236 and a REE of 1641.  Her calculated basal metabolic rate is 10/17/2019 thus her basal metabolic rate is worse than expected.  General: Cooperative, alert, well developed, in no acute distress. HEENT: Conjunctivae and lids unremarkable. Cardiovascular: Regular rhythm.  Lungs: Normal work of breathing. Neurologic: No focal deficits.   Lab Results  Component Value Date   CREATININE 0.67 08/29/2019   BUN 14 08/29/2019   NA 137 08/29/2019   K 4.3 08/29/2019   CL 100 08/29/2019   CO2 23 08/29/2019   Lab Results  Component Value Date   WBC 8.4 08/29/2019   HGB 13.8 08/29/2019   HCT 41.3 08/29/2019   MCV 88 08/29/2019   PLT 265 08/29/2019  Attestation Statements:   Reviewed by clinician on day of visit: allergies, medications, problem list, medical history, surgical history, family history, social history, and previous encounter notes.  I, Insurance claims handler, CMA, am acting as Energy manager for [employer], DO.  I have reviewed the above documentation for accuracy and completeness, and I agree with the above. -Thomasene Lot, DO

## 2019-08-30 LAB — COMPREHENSIVE METABOLIC PANEL
ALT: 12 IU/L (ref 0–32)
AST: 16 IU/L (ref 0–40)
Albumin/Globulin Ratio: 1.8 (ref 1.2–2.2)
Albumin: 4.5 g/dL (ref 3.8–4.8)
Alkaline Phosphatase: 64 IU/L (ref 48–121)
BUN/Creatinine Ratio: 21 (ref 9–23)
BUN: 14 mg/dL (ref 6–20)
Bilirubin Total: 1.1 mg/dL (ref 0.0–1.2)
CO2: 23 mmol/L (ref 20–29)
Calcium: 9.1 mg/dL (ref 8.7–10.2)
Chloride: 100 mmol/L (ref 96–106)
Creatinine, Ser: 0.67 mg/dL (ref 0.57–1.00)
GFR calc Af Amer: 129 mL/min/{1.73_m2} (ref >59)
GFR calc non Af Amer: 112 mL/min/{1.73_m2} (ref >59)
Globulin, Total: 2.5 g/dL (ref 1.5–4.5)
Glucose: 80 mg/dL (ref 65–99)
Potassium: 4.3 mmol/L (ref 3.5–5.2)
Sodium: 137 mmol/L (ref 134–144)
Total Protein: 7 g/dL (ref 6.0–8.5)

## 2019-08-30 LAB — INSULIN, RANDOM: INSULIN: 24.9 u[IU]/mL (ref 2.6–24.9)

## 2019-08-30 LAB — CBC WITH DIFFERENTIAL/PLATELET
Basophils Absolute: 0 10*3/uL (ref 0.0–0.2)
Basos: 1 %
EOS (ABSOLUTE): 0.1 10*3/uL (ref 0.0–0.4)
Eos: 1 %
Hematocrit: 41.3 % (ref 34.0–46.6)
Hemoglobin: 13.8 g/dL (ref 11.1–15.9)
Immature Grans (Abs): 0 10*3/uL (ref 0.0–0.1)
Immature Granulocytes: 0 %
Lymphocytes Absolute: 2.9 10*3/uL (ref 0.7–3.1)
Lymphs: 34 %
MCH: 29.6 pg (ref 26.6–33.0)
MCHC: 33.4 g/dL (ref 31.5–35.7)
MCV: 88 fL (ref 79–97)
Monocytes Absolute: 0.5 10*3/uL (ref 0.1–0.9)
Monocytes: 6 %
Neutrophils Absolute: 4.9 10*3/uL (ref 1.4–7.0)
Neutrophils: 58 %
Platelets: 265 10*3/uL (ref 150–450)
RBC: 4.67 x10E6/uL (ref 3.77–5.28)
RDW: 12.8 % (ref 11.7–15.4)
WBC: 8.4 10*3/uL (ref 3.4–10.8)

## 2019-08-30 LAB — FOLATE: Folate: 11.7 ng/mL (ref >3.0)

## 2019-08-30 LAB — LIPID PANEL
Chol/HDL Ratio: 3.5 ratio (ref 0.0–4.4)
Cholesterol, Total: 202 mg/dL — ABNORMAL HIGH (ref 100–199)
HDL: 58 mg/dL (ref >39)
LDL Chol Calc (NIH): 129 mg/dL — ABNORMAL HIGH (ref 0–99)
Triglycerides: 82 mg/dL (ref 0–149)
VLDL Cholesterol Cal: 15 mg/dL (ref 5–40)

## 2019-08-30 LAB — T4, FREE: Free T4: 1.07 ng/dL (ref 0.82–1.77)

## 2019-08-30 LAB — VITAMIN B12: Vitamin B-12: 455 pg/mL (ref 232–1245)

## 2019-08-30 LAB — TSH: TSH: 1.72 u[IU]/mL (ref 0.450–4.500)

## 2019-08-30 LAB — T3, FREE: T3, Free: 3.3 pg/mL (ref 2.0–4.4)

## 2019-08-30 LAB — HEMOGLOBIN A1C
Est. average glucose Bld gHb Est-mCnc: 105 mg/dL
Hgb A1c MFr Bld: 5.3 % (ref 4.8–5.6)

## 2019-08-30 LAB — VITAMIN D 25 HYDROXY (VIT D DEFICIENCY, FRACTURES): Vit D, 25-Hydroxy: 25.7 ng/mL — ABNORMAL LOW (ref 30.0–100.0)
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
## 2019-09-12 ENCOUNTER — Encounter (INDEPENDENT_AMBULATORY_CARE_PROVIDER_SITE_OTHER): Payer: Self-pay | Admitting: Family Medicine

## 2019-09-12 ENCOUNTER — Ambulatory Visit (INDEPENDENT_AMBULATORY_CARE_PROVIDER_SITE_OTHER): Payer: BC Managed Care – PPO | Admitting: Family Medicine

## 2019-09-12 ENCOUNTER — Other Ambulatory Visit: Payer: Self-pay

## 2019-09-12 VITALS — BP 102/62 | HR 72 | Temp 98.0°F | Ht 67.0 in | Wt 261.0 lb

## 2019-09-12 DIAGNOSIS — Z9189 Other specified personal risk factors, not elsewhere classified: Secondary | ICD-10-CM

## 2019-09-12 DIAGNOSIS — E7849 Other hyperlipidemia: Secondary | ICD-10-CM | POA: Diagnosis not present

## 2019-09-12 DIAGNOSIS — E8881 Metabolic syndrome: Secondary | ICD-10-CM | POA: Diagnosis not present

## 2019-09-12 DIAGNOSIS — I10 Essential (primary) hypertension: Secondary | ICD-10-CM | POA: Diagnosis not present

## 2019-09-12 DIAGNOSIS — E559 Vitamin D deficiency, unspecified: Secondary | ICD-10-CM | POA: Diagnosis not present

## 2019-09-12 DIAGNOSIS — Z6841 Body Mass Index (BMI) 40.0 and over, adult: Secondary | ICD-10-CM

## 2019-09-12 MED ORDER — VITAMIN D (ERGOCALCIFEROL) 1.25 MG (50000 UNIT) PO CAPS: 50000.0000 [IU] | ORAL_CAPSULE | ORAL | 0 refills | Status: DC

## 2019-09-12 NOTE — Progress Notes (Signed)
Chief Complaint:   OBESITY Carla Watson is here to discuss her progress with her obesity treatment plan along with follow-up of her obesity related diagnoses. Carla Watson is on the Category 2 Plan and states she is following her eating plan approximately 80% of the time. Carla Watson states she is doing [employer] in [place] for 45 minutes 4-5 times per week with 2-3 days of strength training.  Today's visit was #: 2 Starting weight: 267 lbs Starting date: 08/29/2019 Today's weight: 261 lbs Today's date: 09/12/2019 Total lbs lost to date: 6 lbs Total lbs lost since last in-office visit: 6 lbs  Interim History:  This is Carla Watson's first follow-up office visit to review labs.  She says the plan was not difficult to follow.  She reports that her hunger and cravings are well-controlled. She snacks on graham crackers with whipped cream.  She ate out 2-3 times.  She had sirloin steak, broccoli, and a salad with vinaigrette dressing.  For 2-3 days, she had to unexpectedly take care of her niece, and she says it was difficult to follow the plan on those days.  She says she feels like she has more energy.  Subjective:   1. Essential hypertension Review: taking medications as instructed, no medication side effects noted, no chest pain on exertion, no dyspnea on exertion, no swelling of ankles.  She is on lisinopril-HCTZ 10-12.5 mg daily.  Denies symptoms.  No concerns.  BP Readings from Last 3 Encounters:  09/12/19 (!) 102/62  08/29/19 132/76  08/27/18 122/70   2. Other hyperlipidemia Carla Watson has hyperlipidemia and has been trying to improve her cholesterol levels with intensive lifestyle modification including a low saturated fat diet, exercise and weight loss. She denies any chest pain, claudication or myalgias.  She has elevated LDL.  Lab Results  Component Value Date   ALT 12 08/29/2019   AST 16 08/29/2019   ALKPHOS 64 08/29/2019   BILITOT 1.1 08/29/2019   Lab Results  Component Value  Date   CHOL 202 (H) 08/29/2019   HDL 58 08/29/2019   LDLCALC 129 (H) 08/29/2019   TRIG 82 08/29/2019   CHOLHDL 3.5 08/29/2019   3. Insulin resistance Carla Watson has a diagnosis of insulin resistance based on her elevated fasting insulin level >5. She continues to work on diet and exercise to decrease her risk of diabetes.  Lab Results  Component Value Date   INSULIN 24.9 08/29/2019   Lab Results  Component Value Date   HGBA1C 5.3 08/29/2019   4. Vitamin D deficiency Carla Watson Vitamin D level was 25.7 on 08/29/2019. She is currently taking no vitamin D supplement. She denies nausea, vomiting or muscle weakness.  5. At risk for complication associated with hypotension The patient is at a higher than average risk of hypotension due to weight loss.  Assessment/Plan:   1. Essential hypertension Discussed labs with patient today.  Carla Watson is working on healthy weight loss and exercise to improve blood pressure control. We will watch for signs of hypotension as she continues her lifestyle modifications.  Check blood pressure at home at least 3 days per week and bring in a BP log.  Will follow closely.  2. Other hyperlipidemia Discussed labs with patient today.  Cardiovascular risk and specific lipid/LDL goals reviewed.  We discussed several lifestyle modifications today and Carla Watson will continue to work on diet, exercise and weight loss efforts. Orders and follow up as documented in patient record.  Recheck labs in 3 months.  Counseling Intensive  lifestyle modifications are the first line treatment for this issue.  Dietary changes: Increase soluble fiber. Decrease simple carbohydrates.  Exercise changes: Moderate to vigorous-intensity aerobic activity 150 minutes per week if tolerated. Lipid-lowering medications: see documented in medical record.  3. Insulin resistance Discussed labs with patient today.  Carla Watson will continue to work on weight loss, exercise, and decreasing simple  carbohydrates to help decrease the risk of diabetes. Carla Watson agreed to follow-up with [name] as directed to closely monitor her progress.  Handouts given after extensive eduction.  Recheck labs in 3 months.  4. Vitamin D deficiency Discussed labs with patient today.  Low Vitamin D level contributes to fatigue and are associated with obesity, breast, and colon cancer. She agrees to start to take prescription Vitamin D @50 ,000 IU every week and will follow-up for routine testing of Vitamin D, at least 2-3 times per year to avoid over-replacement.  Recheck vitamin D level in 3 months. - Vitamin D, Ergocalciferol, (DRISDOL) 1.25 MG (50000 UNIT) CAPS capsule; Take 1 capsule (50,000 Units total) by mouth every 7 (seven) days.  Dispense: 4 capsule; Refill: 0  5. At risk for complication associated with hypotension Carla Watson was given approximately 15 minutes of education and counseling today to help avoid hypotension. We discussed risks of hypotension with weight loss and signs of hypotension such as feeling lightheaded or unsteady.  Repetitive spaced learning was employed today to elicit superior memory formation and behavioral change.  6. Class 3 severe obesity with serious comorbidity and body mass index (BMI) of 40.0 to 44.9 in adult, unspecified obesity type [place]) Carla Watson is currently in the action stage of change. As such, her goal is to continue with weight loss efforts. She has agreed to the Category 2 Plan.   Exercise goals: As is.  Behavioral modification strategies: increasing vegetables, increasing water intake, meal planning and cooking strategies, keeping healthy foods in the home, celebration eating strategies and planning for success.  Carla Watson has agreed to follow-up with our clinic in 2 weeks. She was informed of the importance of frequent follow-up visits to maximize her success with intensive lifestyle modifications for her multiple health conditions.   Objective:   Blood pressure (!)  102/62, pulse 72, temperature 98 F (36.7 C), height 5\' 7"  (1.702 m), weight (!) 261 lb (118.4 kg), last menstrual period 08/16/2019, SpO2 97 %. Body mass index is 40.88 kg/m.  General: Cooperative, alert, well developed, in no acute distress. HEENT: Conjunctivae and lids unremarkable. Cardiovascular: Regular rhythm.  Lungs: Normal work of breathing. Neurologic: No focal deficits.   Lab Results  Component Value Date   CREATININE 0.67 08/29/2019   BUN 14 08/29/2019   NA 137 08/29/2019   K 4.3 08/29/2019   CL 100 08/29/2019   CO2 23 08/29/2019   Lab Results  Component Value Date   ALT 12 08/29/2019   AST 16 08/29/2019   ALKPHOS 64 08/29/2019   BILITOT 1.1 08/29/2019   Lab Results  Component Value Date   HGBA1C 5.3 08/29/2019   Lab Results  Component Value Date   INSULIN 24.9 08/29/2019   Lab Results  Component Value Date   TSH 1.720 08/29/2019   Lab Results  Component Value Date   CHOL 202 (H) 08/29/2019   HDL 58 08/29/2019   LDLCALC 129 (H) 08/29/2019   TRIG 82 08/29/2019   CHOLHDL 3.5 08/29/2019   Lab Results  Component Value Date   WBC 8.4 08/29/2019   HGB 13.8 08/29/2019   HCT 41.3 08/29/2019  MCV 88 08/29/2019   PLT 265 08/29/2019   Attestation Statements:   Reviewed by clinician on day of visit: allergies, medications, problem list, medical history, surgical history, family history, social history, and previous encounter notes.  I, Insurance claims handler, CMA, am acting as Energy manager for [employer], DO.  I have reviewed the above documentation for accuracy and completeness, and I agree with the above. -  Thomasene Lot, DO

## 2019-09-23 ENCOUNTER — Other Ambulatory Visit: Payer: Self-pay

## 2019-09-23 ENCOUNTER — Ambulatory Visit (INDEPENDENT_AMBULATORY_CARE_PROVIDER_SITE_OTHER): Payer: BC Managed Care – PPO | Admitting: Psychology

## 2019-09-23 DIAGNOSIS — F5089 Other specified eating disorder: Secondary | ICD-10-CM | POA: Diagnosis not present

## 2019-09-23 DIAGNOSIS — F419 Anxiety disorder, unspecified: Secondary | ICD-10-CM

## 2019-09-25 NOTE — Progress Notes (Signed)
Office: [phone]  /  Fax: [phone]    Date: October 09, 2019   Appointment Start Time: 3:52pm Duration: 28 minutes Provider: Lawerance Cruel, Psy.D. Type of Session: Individual Therapy  Location of Patient: Work Location of Provider: Provider's Home Type of Contact: Telepsychological Visit via MyChart Video Visit   Informed Consent: Prior to initiating telepsychological services, Ifeoluwa completed an informed consent document, which included the development of a safety plan in the event of an emergency/crisis. Aniyla expressed understanding of the rationale of the safety plan. Harleen verbally acknowledged understanding she is ultimately responsible for understanding her insurance benefits for telepsychological and in-person services. This provider also reviewed confidentiality, as it relates to telepsychological services, as well as the rationale for telepsychological services (i.e., to reduce exposure risk to COVID-19). Deshay  acknowledged understanding that appointments cannot be recorded without both party consent and she is aware she is responsible for securing confidentiality on her end of the session. Briell verbally consented to proceed.  Session Content: Eulogia is a 39 y.o. female presenting for a follow-up appointment to address the previously established treatment goal of increasing coping skills. Today's appointment was a telepsychological visit due to COVID-19. Itali provided verbal consent for today's telepsychological appointment and she is aware she is responsible for securing confidentiality on her end of the session. Prior to proceeding with today's appointment, Arline's physical location at the time of this appointment was obtained as well a phone number she could be reached at in the event of technical difficulties. Diavian and this provider participated in today's telepsychological service.   Of note, today's appointment was switched to a regular telephone call at  3:59pm with Hildagarde's verbal consent due to technical issues.   This provider conducted a brief check-in. Eliot shared school started again, noting, "It's been a little crazy." The aforementioned impacted her routine, adding it is starting to improve. She also acknowledged challenges with her eating habits. Session focused on engaging Amethyst in problem solving to help her with her routine, specifically as it relates to her eating habits (e.g., having a working plan for meals; doubling recipes). Additionally, psychoeducation regarding all or nothing thinking was provided. Lynniah was receptive to today's appointment as evidenced by openness to sharing, responsiveness to feedback, and willingness to implement discussed strategies.  Mental Status Examination:  Appearance: casually dressed Behavior: appropriate to circumstances Mood: euthymic Affect: mood congruent Speech: normal in rate, volume, and tone Eye Contact: appropriate Psychomotor Activity: appropriate Gait: unable to assess Thought Process: linear, logical, and goal directed  Thought Content/Perception: no hallucinations, delusions, bizarre thinking or behavior reported or observed and no evidence of suicidal and homicidal ideation, plan, and intent Orientation: time, person, place, and purpose of appointment Memory/Concentration: memory, attention, language, and fund of knowledge intact  Insight/Judgment: good  Interventions:  Conducted a brief chart review Provided empathic reflections and validation Employed supportive psychotherapy interventions to facilitate reduced distress and to improve coping skills with identified stressors Engaged patient in problem solving  Psychoeducation regarding all or nothing thinking provided  DSM-5 Diagnosis(es): 307.59 (F50.8) Other Specified Feeding or Eating Disorder, Emotional and Binge Eating Behaviors and 300.00 (F41.9) Unspecified Anxiety Disorder  Treatment Goal & Progress: During the  initial appointment with this provider, the following treatment goal was established: increase coping skills. Progress is limited, as [name] has just begun treatment with this provider; however, she is receptive to the interaction and interventions and rapport is being established.   Plan: The next appointment will be scheduled in three weeks,  which will be via MyChart Video Visit. The next session will focus on working towards the established treatment goal.

## 2019-09-26 ENCOUNTER — Other Ambulatory Visit: Payer: Self-pay

## 2019-09-26 ENCOUNTER — Encounter (INDEPENDENT_AMBULATORY_CARE_PROVIDER_SITE_OTHER): Payer: Self-pay | Admitting: Family Medicine

## 2019-09-26 ENCOUNTER — Ambulatory Visit (INDEPENDENT_AMBULATORY_CARE_PROVIDER_SITE_OTHER): Payer: BC Managed Care – PPO | Admitting: Family Medicine

## 2019-09-26 VITALS — BP 105/67 | HR 69 | Temp 98.1°F | Ht 67.0 in | Wt 258.0 lb

## 2019-09-26 DIAGNOSIS — Z6841 Body Mass Index (BMI) 40.0 and over, adult: Secondary | ICD-10-CM

## 2019-09-26 DIAGNOSIS — Z9189 Other specified personal risk factors, not elsewhere classified: Secondary | ICD-10-CM | POA: Diagnosis not present

## 2019-09-26 DIAGNOSIS — E8881 Metabolic syndrome: Secondary | ICD-10-CM | POA: Diagnosis not present

## 2019-09-26 DIAGNOSIS — E559 Vitamin D deficiency, unspecified: Secondary | ICD-10-CM

## 2019-09-26 DIAGNOSIS — I10 Essential (primary) hypertension: Secondary | ICD-10-CM

## 2019-09-26 NOTE — Progress Notes (Signed)
Chief Complaint:   OBESITY Carla Watson is here to discuss her progress with her obesity treatment plan along with follow-up of her obesity related diagnoses. Carla Watson is on the Category 2 Plan and states she is following her eating plan approximately 60% of the time. Carla Watson states she is doing boot camp, walking for 30 minutes, and swimming for 45 minutes 4 times per week.  Today's visit was #: 3 Starting weight: 267 lbs Starting date: 08/29/2019 Today's weight: 258 lbs Today's date: 09/30/2019 Total lbs lost to date: 9 lbs Total lbs lost since last in-office visit: 3 lbs  Interim History: Carla Watson says she was off track for 1 week and then back on the second week.  She says the meal plan is still working out well.  She met with Dr. Mallie Mussel this past Monday.  She eats after dinner because of boredom/habit.  She says she got a food scale and is able to get in all her proteins.  She was surprised at how much food it was.  Subjective:   1. Insulin resistance Carla Watson has a diagnosis of insulin resistance based on her elevated fasting insulin level >5. She continues to work on diet and exercise to decrease her risk of diabetes.    Lab Results  Component Value Date   INSULIN 24.9 08/29/2019   Lab Results  Component Value Date   HGBA1C 5.3 08/29/2019   2. Essential hypertension Review: taking medications as instructed, no medication side effects noted, no chest pain on exertion, no dyspnea on exertion, no swelling of ankles.  At home, blood pressure runs 130s/70s consistently.  Denies symptoms of dizziness/lightheadedness.  BP Readings from Last 3 Encounters:  09/26/19 105/67  09/12/19 (!) 102/62  08/29/19 132/76   3. Vitamin D deficiency Carla Watson's Vitamin D level was 25.7 on 08/29/2019. She is currently taking prescription vitamin D 50,000 IU each week. She denies nausea, vomiting or muscle weakness.  4. At risk for dehydration Carla Watson is at risk for dehydration due to inadequate  water intake.  Assessment/Plan:   1. Insulin resistance Carla Watson will continue to work on weight loss, exercise, and decreasing simple carbohydrates to help decrease the risk of diabetes. Carla Watson agreed to follow-up with [name] as directed to closely monitor her progress.  Continue prudent nutritional plan and weight loss.  2. Essential hypertension Carla Watson is working on healthy weight loss and exercise to improve blood pressure control. We will watch for signs of hypotension as she continues her lifestyle modifications.  Continue current medications for now.  Check at home and keep log.  Consider decreasing dose at next office visit with continued weight loss.  3. Vitamin D deficiency Low Vitamin D level contributes to fatigue and are associated with obesity, breast, and colon cancer. She agrees to continue to take prescription Vitamin D _0 ,000 IU every week and will follow-up for routine testing of Vitamin D, at least 2-3 times per year to avoid over-replacement. - Vitamin D, Ergocalciferol, (DRISDOL) 1.25 MG (50000 UNIT) CAPS capsule; Take 1 capsule (50,000 Units total) by mouth every 7 (seven) days.  Dispense: 4 capsule; Refill: 0  4. At risk for dehydration Carla Watson was given approximately 15 minutes dehydration prevention counseling today. Carla Watson is at risk for dehydration due to weight loss and current medication(s). She was encouraged to hydrate and monitor fluid status to avoid dehydration as well as weight loss plateaus.   5. Class 3 severe obesity with serious comorbidity and body mass index (BMI) of 40.0 to 44.9  in adult, unspecified obesity type Carla Watson) Carla Watson is currently in the action stage of change. As such, her goal is to continue with weight loss efforts. She has agreed to the Category 2 Plan.   Exercise goals: As is.  Behavioral modification strategies: decreasing eating out, no skipping meals, meal planning and cooking strategies, keeping healthy foods in the home and planning  for success.  [name] has agreed to follow-up with our clinic in 2 weeks. She was informed of the importance of frequent follow-up visits to maximize her success with intensive lifestyle modifications for her multiple health conditions.   Objective:   Blood pressure 105/67, pulse 69, temperature 98.1 F (36.7 C), height 5' 7" (1.702 m), weight 258 lb (117 kg), SpO2 97 %. Body mass index is 40.41 kg/m.  General: Cooperative, alert, well developed, in no acute distress. HEENT: Conjunctivae and lids unremarkable. Cardiovascular: Regular rhythm.  Lungs: Normal work of breathing. Neurologic: No focal deficits.   Lab Results  Component Value Date   CREATININE 0.67 08/29/2019   BUN 14 08/29/2019   NA 137 08/29/2019   K 4.3 08/29/2019   CL 100 08/29/2019   CO2 23 08/29/2019   Lab Results  Component Value Date   ALT 12 08/29/2019   AST 16 08/29/2019   ALKPHOS 64 08/29/2019   BILITOT 1.1 08/29/2019   Lab Results  Component Value Date   HGBA1C 5.3 08/29/2019   Lab Results  Component Value Date   INSULIN 24.9 08/29/2019   Lab Results  Component Value Date   TSH 1.720 08/29/2019   Lab Results  Component Value Date   CHOL 202 (H) 08/29/2019   HDL 58 08/29/2019   LDLCALC 129 (H) 08/29/2019   TRIG 82 08/29/2019   CHOLHDL 3.5 08/29/2019   Lab Results  Component Value Date   WBC 8.4 08/29/2019   HGB 13.8 08/29/2019   HCT 41.3 08/29/2019   MCV 88 08/29/2019   PLT 265 08/29/2019   Attestation Statements:   Reviewed by clinician on day of visit: allergies, medications, problem list, medical history, surgical history, family history, social history, and previous encounter notes.  I, Water quality scientist, CMA, am acting as Location manager for [employer], DO.  I have reviewed the above documentation for accuracy and completeness, and I agree with the above. -  Mellody Dance, DO

## 2019-09-30 MED ORDER — VITAMIN D (ERGOCALCIFEROL) 1.25 MG (50000 UNIT) PO CAPS: 50000.0000 [IU] | ORAL_CAPSULE | ORAL | 0 refills | Status: DC

## 2019-10-09 ENCOUNTER — Other Ambulatory Visit: Payer: Self-pay

## 2019-10-09 ENCOUNTER — Telehealth (INDEPENDENT_AMBULATORY_CARE_PROVIDER_SITE_OTHER): Payer: BC Managed Care – PPO | Admitting: Psychology

## 2019-10-09 DIAGNOSIS — F5089 Other specified eating disorder: Secondary | ICD-10-CM | POA: Diagnosis not present

## 2019-10-09 DIAGNOSIS — F419 Anxiety disorder, unspecified: Secondary | ICD-10-CM

## 2019-10-10 ENCOUNTER — Other Ambulatory Visit: Payer: Self-pay

## 2019-10-10 ENCOUNTER — Encounter (INDEPENDENT_AMBULATORY_CARE_PROVIDER_SITE_OTHER): Payer: Self-pay | Admitting: Adult Health

## 2019-10-10 ENCOUNTER — Encounter (INDEPENDENT_AMBULATORY_CARE_PROVIDER_SITE_OTHER): Payer: Self-pay

## 2019-10-10 ENCOUNTER — Ambulatory Visit (INDEPENDENT_AMBULATORY_CARE_PROVIDER_SITE_OTHER): Payer: BC Managed Care – PPO | Admitting: Adult Health

## 2019-10-10 ENCOUNTER — Ambulatory Visit (INDEPENDENT_AMBULATORY_CARE_PROVIDER_SITE_OTHER): Payer: BC Managed Care – PPO | Admitting: Family Medicine

## 2019-10-10 VITALS — BP 114/71 | HR 69 | Temp 98.2°F | Ht 67.0 in | Wt 258.0 lb

## 2019-10-10 DIAGNOSIS — Z9189 Other specified personal risk factors, not elsewhere classified: Secondary | ICD-10-CM

## 2019-10-10 DIAGNOSIS — E559 Vitamin D deficiency, unspecified: Secondary | ICD-10-CM | POA: Diagnosis not present

## 2019-10-10 DIAGNOSIS — E8881 Metabolic syndrome: Secondary | ICD-10-CM | POA: Insufficient documentation

## 2019-10-10 DIAGNOSIS — E782 Mixed hyperlipidemia: Secondary | ICD-10-CM | POA: Diagnosis not present

## 2019-10-10 DIAGNOSIS — Z6841 Body Mass Index (BMI) 40.0 and over, adult: Secondary | ICD-10-CM

## 2019-10-10 MED ORDER — VITAMIN D (ERGOCALCIFEROL) 1.25 MG (50000 UNIT) PO CAPS: 50000.0000 [IU] | ORAL_CAPSULE | ORAL | 0 refills | Status: DC

## 2019-10-14 NOTE — Progress Notes (Signed)
Chief Complaint:   OBESITY Carla Watson is here to discuss her progress with her obesity treatment plan along with follow-up of her obesity related diagnoses. Carla Watson is on the Category 2 Plan and states she is following her eating plan approximately 50% of the time. Carla Watson states she is doing [employer] 45 minutes 3-4 times per week.  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  Subjective:   Vitamin D deficiency. Carla Watson is on Ergocalciferol. No nausea, vomiting, or muscle weakness.    Ref. Range 08/29/2019 15:30  Vitamin D, 25-Hydroxy Latest Ref Range: 30.0 - 100.0 ng/mL 25.7 (L)   Mixed hyperlipidemia. Carla Watson has hyperlipidemia and has been trying to improve her cholesterol levels with intensive lifestyle modification including a low saturated fat diet, exercise and weight loss. She denies any chest pain, claudication or myalgias. Total and LDL levels were both elevated at last check. Carla Watson is not on a stain.  Lab Results  Component Value Date   ALT 12 08/29/2019   AST 16 08/29/2019   ALKPHOS 64 08/29/2019   BILITOT 1.1 08/29/2019   Lab Results  Component Value Date   CHOL 202 (H) 08/29/2019   HDL 58 08/29/2019   LDLCALC 129 (H) 08/29/2019   TRIG 82 08/29/2019   CHOLHDL 3.5 08/29/2019   Insulin resistance. Carla Watson has a diagnosis of insulin resistance based on her elevated fasting insulin level >5. She continues to work on diet and exercise to decrease her risk of diabetes. Insulin level was elevated at last check, however, blood glucose and A1c  levels were within normal limits.  Lab Results  Component Value Date   INSULIN 24.9 08/29/2019   Lab Results  Component Value Date   HGBA1C 5.3 08/29/2019   At risk for diabetes mellitus. Carla Watson is at higher than average risk for developing diabetes due to insulin resistance and obesity.   Assessment/Plan:   Vitamin D deficiency. Low Vitamin D level contributes to fatigue and are associated with obesity, breast, and colon cancer. She was given a refill on her Vitamin D, Ergocalciferol, (DRISDOL) 1.25 MG (50000 UNIT) CAPS capsule every week #4 with 0 refills and will follow-up for routine testing of Vitamin D every 3 months.   Mixed hyperlipidemia. Cardiovascular risk and specific lipid/LDL goals reviewed.  We discussed several lifestyle modifications today and Carla Watson will continue to work on diet, exercise and weight loss efforts. Orders and follow up as documented in patient record. Carla Watson will reduce her intake of saturated fat. Labs will be checked every 3 months.   Counseling Intensive lifestyle modifications are the first line treatment for this issue.  Dietary changes: Increase soluble fiber. Decrease simple carbohydrates.  Exercise changes: Moderate to vigorous-intensity aerobic activity 150 minutes per week if tolerated.  Lipid-lowering medications: see documented in medical record.  Insulin resistance. Carla Watson will continue to work on weight loss, exercise, and decreasing simple carbohydrates to help decrease the risk of diabetes. Carla Watson agreed to follow-up with [name] as directed to closely monitor her progress. She will increase protein and limit her intake of  simple carbohydrates. Labs will be checked every 3 months.   At risk for diabetes mellitus. Carla Watson was given approximately 5 minutes of diabetes education and counseling today. We discussed intensive lifestyle modifications today with an emphasis on weight loss as well as increasing exercise and decreasing simple  carbohydrates in her diet. We also reviewed medication options with an emphasis on risk versus benefit of those discussed.   Repetitive spaced learning was employed today to elicit superior memory formation and behavioral change.  Class 3 severe obesity with serious comorbidity and body mass index (BMI) of 40.0 to 44.9 in adult, unspecified obesity type (HCC).  Carla Watson is currently in the action stage of change. As such, her goal is to continue with weight loss efforts. She has agreed to the Category 2 Plan.   Handout was provided on Additional Breakfast Options.  Exercise goals: Carla Watson will continue [employer] 45 minutes 3-4 times a week.  Behavioral modification strategies: increasing lean protein intake, increasing water intake, no skipping meals, meal planning and cooking strategies and planning for success.  Alton has agreed to follow-up with our clinic in 2 weeks. She was informed of the importance of frequent follow-up visits to maximize her success with intensive lifestyle modifications for her multiple health conditions.   Objective:   Blood pressure 114/71, pulse 69, temperature 98.2 F (36.8 C), temperature source Oral, height 5\' 7"  (1.702 m), weight 258 lb (117 kg), SpO2 97 %. Body mass index is 40.41 kg/m.  General: Cooperative, alert, well developed, in no acute distress. HEENT: Conjunctivae and lids unremarkable. Cardiovascular: Regular rhythm.  Lungs: Normal work of breathing. Neurologic: No focal deficits.   Lab Results  Component Value Date   CREATININE 0.67 08/29/2019   BUN 14 08/29/2019   NA 137 08/29/2019   K 4.3 08/29/2019   CL 100 08/29/2019   CO2 23 08/29/2019   Lab Results  Component Value Date   ALT 12 08/29/2019   AST 16 08/29/2019   ALKPHOS 64 08/29/2019   BILITOT 1.1 08/29/2019   Lab Results  Component Value Date   HGBA1C 5.3 08/29/2019   Lab Results  Component Value Date   INSULIN 24.9 08/29/2019   Lab Results  Component Value Date    TSH 1.720 08/29/2019   Lab Results  Component Value Date   CHOL 202 (H) 08/29/2019   HDL 58 08/29/2019   LDLCALC 129 (H) 08/29/2019   TRIG 82 08/29/2019   CHOLHDL 3.5 08/29/2019   Lab Results  Component Value Date   WBC 8.4 08/29/2019   HGB 13.8 08/29/2019   HCT 41.3 08/29/2019   MCV 88 08/29/2019   PLT 265 08/29/2019   No results found for: IRON, TIBC, FERRITIN  Attestation Statements:   Reviewed by clinician on day of visit: allergies, medications, problem list, medical history, surgical history, family history, social history, and previous encounter notes.  I, 08/31/2019, am acting as [employer] for [employer] manager, NP-C   I have reviewed the above documentation for accuracy and completeness, and I agree with the above. -  [employer], NP

## 2019-10-16 NOTE — Progress Notes (Signed)
  Office: [phone]  /  Fax: [phone]    Date: October 30, 2019   Appointment Start Time: 3:57pm Duration: 33 minutes  Provider: Lawerance Cruel, Psy.D. Type of Session: Individual Therapy  Location of Patient: Work Location of Provider: Provider's Home Type of Contact: Telepsychological Visit via MyChart Video Visit  Session Content: Carla Watson is a 39 y.o. female presenting for a follow-up appointment to address the previously established treatment goal of increasing coping skills. Today's appointment was a telepsychological visit due to COVID-19. Carla Watson provided verbal consent for today's telepsychological appointment and she is aware she is responsible for securing confidentiality on her end of the session. Prior to proceeding with today's appointment, Carla Watson's physical location at the time of this appointment was obtained as well a phone number she could be reached at in the event of technical difficulties. Carla Watson and this provider participated in today's telepsychological service.   This provider conducted a brief check-in. Carla Watson shared she has been busy with work. She noted an improvement in eating habits, adding she will try to make additional servings to make it easier for work days. Carla Watson also shared a reduction in all or nothing thinking. Positive reinforcement was provided. Psychoeducation regarding triggers for emotional eating was provided. Carla Watson was provided a handout, and encouraged to utilize the handout between now and the next appointment to increase awareness of triggers and frequency. Carla Watson agreed. This provider also discussed behavioral strategies for specific triggers, such as placing the utensil down when conversing to avoid mindless eating. Carla Watson provided verbal consent during today's appointment for this provider to send a handout about triggers via e-mail. Carla Watson was receptive to today's appointment as evidenced by openness to sharing, responsiveness to  feedback, and willingness to explore triggers for emotional eating.  Mental Status Examination:  Appearance: well groomed and appropriate hygiene  Behavior: appropriate to circumstances Mood: euthymic Affect: mood congruent Speech: normal in rate, volume, and tone Eye Contact: appropriate Psychomotor Activity: appropriate Gait: unable to assess Thought Process: linear, logical, and goal directed  Thought Content/Perception: no hallucinations, delusions, bizarre thinking or behavior reported or observed and no evidence of suicidal and homicidal ideation, plan, and intent Orientation: time, person, place, and purpose of appointment Memory/Concentration: memory, attention, language, and fund of knowledge intact  Insight/Judgment: good  Interventions:  Conducted a brief chart review Provided empathic reflections and validation Provided positive reinforcement Employed supportive psychotherapy interventions to facilitate reduced distress and to improve coping skills with identified stressors Psychoeducation provided regarding triggers for emotional eating  DSM-5 Diagnosis(es): 307.59 (F50.8) Other Specified Feeding or Eating Disorder, Emotional and Binge Eating Behaviors and 300.00 (F41.9) Unspecified Anxiety Disorder  Treatment Goal & Progress: During the initial appointment with this provider, the following treatment goal was established: increase coping skills. Carla Watson has demonstrated progress in her goal as evidenced by increased awareness of hunger patterns.   Plan: Due to Carla Watson's upcoming trip, the next appointment will be scheduled in 2-3 weeks, which will be via MyChart Video Visit. The next session will focus on working towards the established treatment goal.

## 2019-10-24 ENCOUNTER — Encounter (INDEPENDENT_AMBULATORY_CARE_PROVIDER_SITE_OTHER): Payer: Self-pay | Admitting: Adult Health

## 2019-10-24 ENCOUNTER — Ambulatory Visit (INDEPENDENT_AMBULATORY_CARE_PROVIDER_SITE_OTHER): Payer: BC Managed Care – PPO | Admitting: Adult Health

## 2019-10-24 ENCOUNTER — Other Ambulatory Visit: Payer: Self-pay

## 2019-10-24 VITALS — BP 107/72 | HR 71 | Temp 98.3°F | Ht 67.0 in | Wt 258.0 lb

## 2019-10-24 DIAGNOSIS — E559 Vitamin D deficiency, unspecified: Secondary | ICD-10-CM | POA: Diagnosis not present

## 2019-10-24 DIAGNOSIS — Z6841 Body Mass Index (BMI) 40.0 and over, adult: Secondary | ICD-10-CM

## 2019-10-24 DIAGNOSIS — I10 Essential (primary) hypertension: Secondary | ICD-10-CM | POA: Diagnosis not present

## 2019-10-24 DIAGNOSIS — Z9189 Other specified personal risk factors, not elsewhere classified: Secondary | ICD-10-CM

## 2019-10-24 MED ORDER — VITAMIN D (ERGOCALCIFEROL) 1.25 MG (50000 UNIT) PO CAPS: 50000.0000 [IU] | ORAL_CAPSULE | ORAL | 0 refills | Status: DC

## 2019-10-28 NOTE — Progress Notes (Signed)
Chief Complaint:   OBESITY Carla Watson is here to discuss her progress with her obesity treatment plan along with follow-up of her obesity related diagnoses. Carla Watson is on the Category 2 Plan and states she is following her eating plan approximately 60% of the time. Carla Watson states she is doing boot camp for 45 minutes 3-4 times per week.  Today's visit was #: 5 Starting weight: 267 lbs Starting date: 08/29/2019 Today's weight: 258 lbs Today's date: 10/24/2019 Total lbs lost to date: 9 lbs Total lbs lost since last in-office visit: 0  Interim History: Carla Watson feels that her clothes fit better and that she feels less bloated since she started the program.  School has settled into a routine and she has more time to focus with eating on plan.  She has been trying to conscientiously increase her protein at each meal.  Subjective:   1. Essential hypertension Review: taking medications as instructed, no medication side effects noted, no chest pain on exertion, no dyspnea on exertion, no swelling of ankles.  Blood pressure is at goal at office visit.  She is on lisinopril-HCTZ 10/12.5 mg daily.  On 08/29/2019, CMP was stable.  She will occasionally experience dizziness with sudden position changes.   BP Readings from Last 3 Encounters:  10/24/19 107/72  10/10/19 114/71  09/26/19 105/67   2. Vitamin D deficiency Tikia's Vitamin D level was 25.7 on 08/29/2019. She is currently taking prescription vitamin D 50,000 IU each week. She denies nausea, vomiting or muscle weakness.  3. At risk for complication associated with hypotension The patient is at a higher than average risk of hypotension due to steady weight loss, blood pressure steadily decreasing, on lisinopril-HCTZ daily, and monitor for hypotension.  Assessment/Plan:   1. Essential hypertension Carla Watson is working on healthy weight loss and exercise to improve blood pressure control. We will watch for signs of hypotension as she  continues her lifestyle modifications.  Increase water intake.  Closely monitor for hypotension.  Will adjust antihypertensive as appropriate.  2. Vitamin D deficiency Low Vitamin D level contributes to fatigue and are associated with obesity, breast, and colon cancer. She agrees to continue to take prescription Vitamin D @50 ,000 IU every week and will follow-up for routine testing of Vitamin D, at least 2-3 times per year to avoid over-replacement.  -Refill Vitamin D, Ergocalciferol, (DRISDOL) 1.25 MG (50000 UNIT) CAPS capsule; Take 1 capsule (50,000 Units total) by mouth every 7 (seven) days.  Dispense: 4 capsule; Refill: 0  3. At risk for complication associated with hypotension Carla Watson was given approximately 15 minutes of education and counseling today to help avoid hypotension. We discussed risks of hypotension with weight loss and signs of hypotension such as feeling lightheaded or unsteady.  Repetitive spaced learning was employed today to elicit superior memory formation and behavioral change.  4. Class 3 severe obesity with serious comorbidity and body mass index (BMI) of 40.0 to 44.9 in adult, unspecified obesity type [facility]) Carla Watson is currently in the action stage of change. As such, her goal is to continue with weight loss efforts. She has agreed to the Category 2 Plan.   Exercise goals: boot camp for 45 minutes 3-4 times per week.  Behavioral modification strategies: increasing lean protein intake, decreasing simple carbohydrates, decreasing eating out, meal planning and cooking strategies, better snacking choices and planning for success.  Carla Watson has agreed to follow-up with our clinic in 2 weeks. She was informed of the importance of frequent follow-up visits to  maximize her success with intensive lifestyle modifications for her multiple health conditions.   Objective:   Blood pressure 107/72, pulse 71, temperature 98.3 F (36.8 C), temperature source Oral, height 5\' 7"  (1.702  m), weight 258 lb (117 kg), SpO2 98 %. Body mass index is 40.41 kg/m.  General: Cooperative, alert, well developed, in no acute distress. HEENT: Conjunctivae and lids unremarkable. Cardiovascular: Regular rhythm.  Lungs: Normal work of breathing. Neurologic: No focal deficits.   Lab Results  Component Value Date   CREATININE 0.67 08/29/2019   BUN 14 08/29/2019   NA 137 08/29/2019   K 4.3 08/29/2019   CL 100 08/29/2019   CO2 23 08/29/2019   Lab Results  Component Value Date   ALT 12 08/29/2019   AST 16 08/29/2019   ALKPHOS 64 08/29/2019   BILITOT 1.1 08/29/2019   Lab Results  Component Value Date   HGBA1C 5.3 08/29/2019   Lab Results  Component Value Date   INSULIN 24.9 08/29/2019   Lab Results  Component Value Date   TSH 1.720 08/29/2019   Lab Results  Component Value Date   CHOL 202 (H) 08/29/2019   HDL 58 08/29/2019   LDLCALC 129 (H) 08/29/2019   TRIG 82 08/29/2019   CHOLHDL 3.5 08/29/2019   Lab Results  Component Value Date   WBC 8.4 08/29/2019   HGB 13.8 08/29/2019   HCT 41.3 08/29/2019   MCV 88 08/29/2019   PLT 265 08/29/2019   Attestation Statements:   Reviewed by clinician on day of visit: allergies, medications, problem list, medical history, surgical history, family history, social history, and previous encounter notes.  I, 08/31/2019, CMA, am acting as Insurance claims handler for Energy manager, NP.  I have reviewed the above documentation for accuracy and completeness, and I agree with the above. -  William Hamburger, NP

## 2019-10-30 ENCOUNTER — Telehealth (INDEPENDENT_AMBULATORY_CARE_PROVIDER_SITE_OTHER): Payer: BC Managed Care – PPO | Admitting: Psychology

## 2019-10-30 ENCOUNTER — Other Ambulatory Visit: Payer: Self-pay

## 2019-10-30 DIAGNOSIS — F419 Anxiety disorder, unspecified: Secondary | ICD-10-CM

## 2019-10-30 DIAGNOSIS — I10 Essential (primary) hypertension: Secondary | ICD-10-CM | POA: Insufficient documentation

## 2019-10-30 DIAGNOSIS — F5089 Other specified eating disorder: Secondary | ICD-10-CM | POA: Diagnosis not present

## 2019-11-04 NOTE — Progress Notes (Signed)
Office: [phone]  /  Fax: [phone]    Date: November 18, 2019   Appointment Start Time: 4:30pm Duration: 34 minutes Provider: Lawerance Cruel, Psy.D. Type of Session: Individual Therapy  Location of Patient: Work Location of Provider: Provider's Home Type of Contact: Telepsychological Visit via MyChart Video Visit  Session Content: Carla Watson is a 39 y.o. female presenting for a follow-up appointment to address the previously established treatment goal of increasing coping skills. Today's appointment was a telepsychological visit due to COVID-19. [name] provided verbal consent for today's telepsychological appointment and she is aware she is responsible for securing confidentiality on her end of the session. Prior to proceeding with today's appointment, [name]'s physical location at the time of this appointment was obtained as well a phone number she could be reached at in the event of technical difficulties. [name] and this provider participated in today's telepsychological service.   This provider conducted a brief check-in. [name] shared she has been busy with work, noting she has also been focusing on physical activity. Additionally, she shared about a recent vacation. Regarding eating, [name] discussed recent "struggles" with eating due to being busy and being on vacation, noting she tried to make better choices and engage in portion control. She also discussed focusing on her protein intake. Positive reinforcement was provided. Reviewed triggers for emotional eating. Moreover, psychoeducation regarding mindfulness was provided. A handout was provided to [place] with further information regarding mindfulness, including exercises. This provider also explained the benefit of mindfulness as it relates to emotional eating. [name] was encouraged to engage in the provided exercises between now and the next appointment with this provider. [name] agreed. During today's appointment, [name] was  led through a mindfulness exercise involving her senses. [name] provided verbal consent during today's appointment for this provider to send a handout about mindfulness via e-mail. Notably, this provider discussed her upcoming maternity leave toward the end of November. [name] acknowledged understanding given the uncertain nature of the circumstances, this provider may be out of the office sooner. This provider and [name] discussed referral options and verbal consent was provided for this provider to send a list of referral options via e-mail. All questions/concerns were addressed. [name] denied any concerns. [name] was receptive to today's appointment as evidenced by openness to sharing, responsiveness to feedback, and willingness to engage in mindfulness exercises to assist with coping.  Mental Status Examination:  Appearance: well groomed and appropriate hygiene  Behavior: appropriate to circumstances Mood: euthymic Affect: mood congruent Speech: normal in rate, volume, and tone Eye Contact: appropriate Psychomotor Activity: appropriate Gait: unable to assess Thought Process: linear, logical, and goal directed  Thought Content/Perception: no hallucinations, delusions, bizarre thinking or behavior reported or observed and no evidence of suicidal and homicidal ideation, plan, and intent Orientation: time, person, place, and purpose of appointment Memory/Concentration: memory, attention, language, and fund of knowledge intact  Insight/Judgment: good  Interventions:  Conducted a brief chart review Provided empathic reflections and validation Reviewed content from the previous session Provided positive reinforcement Employed supportive psychotherapy interventions to facilitate reduced distress and to improve coping skills with identified stressors Psychoeducation provided regarding mindfulness Engaged patient in mindfulness exercise(s) Employed acceptance and commitment interventions to  emphasize mindfulness and acceptance without struggle  DSM-5 Diagnosis(es): 307.59 (F50.8) Other Specified Feeding or Eating Disorder, Emotional and Binge Eating Behaviors and 300.00 (F41.9) Unspecified Anxiety Disorder  Treatment Goal & Progress: During the initial appointment with this provider, the following treatment goal was established: increase coping skills. [name] has demonstrated progress in  her goal as evidenced by increased awareness of hunger patterns and increased awareness of triggers for emotional eating. [name] also demonstrates willingness to engage in mindfulness exercises.  Plan: The next appointment will be scheduled in two weeks, which will be via MyChart Video Visit. The next session will focus on working towards the established treatment goal.

## 2019-11-07 ENCOUNTER — Ambulatory Visit (INDEPENDENT_AMBULATORY_CARE_PROVIDER_SITE_OTHER): Payer: BC Managed Care – PPO | Admitting: Adult Health

## 2019-11-07 ENCOUNTER — Encounter (INDEPENDENT_AMBULATORY_CARE_PROVIDER_SITE_OTHER): Payer: Self-pay | Admitting: Adult Health

## 2019-11-07 ENCOUNTER — Other Ambulatory Visit: Payer: Self-pay

## 2019-11-07 VITALS — BP 116/79 | HR 66 | Temp 98.0°F | Ht 67.0 in | Wt 257.0 lb

## 2019-11-07 DIAGNOSIS — E559 Vitamin D deficiency, unspecified: Secondary | ICD-10-CM

## 2019-11-07 DIAGNOSIS — I10 Essential (primary) hypertension: Secondary | ICD-10-CM | POA: Diagnosis not present

## 2019-11-07 DIAGNOSIS — Z6841 Body Mass Index (BMI) 40.0 and over, adult: Secondary | ICD-10-CM | POA: Diagnosis not present

## 2019-11-11 NOTE — Progress Notes (Addendum)
Chief Complaint:   OBESITY Carla Watson is here to discuss her progress with her obesity treatment plan along with follow-up of her obesity related diagnoses. Carla Watson is on the Category 2 Plan and states she is following her eating plan approximately 70% of the time. Carla Watson states she is walking/doing boot camp for 45 minutes 3-4 times per week.  Today's visit was #: 6 Starting weight: 267 lbs Starting date: 08/29/2019 Today's weight: 257 lbs Today's date: 11/07/2019 Total lbs lost to date: 10 lbs Total lbs lost since last in-office visit: 1 lb  Interim History: Carla Watson will wax/wane between following the plan consistently and then relaxing her eating habits for 2-3 days.  She is really working hard on making mindful/healthy food choices.  She has been trying to choose the leanest protein options and avoiding simple carbohydrates when eating out.  Subjective:   1. Essential hypertension Review: taking medications as instructed, no medication side effects noted, no chest pain on exertion, no dyspnea on exertion, no swelling of ankles.  BP/HR excellent in office today.  She is on lisinopril/HCTZ 10/12.5 mg daily.  BP Readings from Last 3 Encounters:  11/07/19 116/79  10/24/19 107/72  10/10/19 114/71   2. Vitamin D deficiency Carla Watson's Vitamin D level was 25.7 on 08/29/2019. She is currently taking prescription vitamin D 50,000 IU each week. She denies nausea, vomiting or muscle weakness.    Assessment/Plan:   1. Essential hypertension [name] is working on healthy weight loss and exercise to improve blood pressure control. We will watch for signs of hypotension as she continues her lifestyle modifications.  Continue Category 2 meal plan and lisinopril/HCTZ daily.  2. Vitamin D deficiency Low Vitamin D level contributes to fatigue and are associated with obesity, breast, and colon cancer. She agrees to continue to take prescription Vitamin D @50 ,000 IU every week and will follow-up  for routine testing of Vitamin D, at least 2-3 times per year to avoid over-replacement.  3. Class 3 severe obesity with serious comorbidity and body mass index (BMI) of 40.0 to 44.9 in adult, unspecified obesity type [facility])  Carla Watson is currently in the action stage of change. As such, her goal is to continue with weight loss efforts. She has agreed to the Category 2 Plan.   Exercise goals: Walking/doing boot camp for 45 minutes for 3-4 days per week.   Handout provided:  Eating Out Guide.  Behavioral modification strategies: increasing lean protein intake, decreasing simple carbohydrates, meal planning and cooking strategies, better snacking choices and planning for success.  Carla Watson has agreed to follow-up with our clinic in 2 weeks. She was informed of the importance of frequent follow-up visits to maximize her success with intensive lifestyle modifications for her multiple health conditions.   Objective:   Blood pressure 116/79, pulse 66, temperature 98 F (36.7 C), height 5\' 7"  (1.702 m), weight 257 lb (116.6 kg), SpO2 97 %. Body mass index is 40.25 kg/m.  General: Cooperative, alert, well developed, in no acute distress. HEENT: Conjunctivae and lids unremarkable. Cardiovascular: Regular rhythm.  Lungs: Normal work of breathing. Neurologic: No focal deficits.   Lab Results  Component Value Date   CREATININE 0.67 08/29/2019   BUN 14 08/29/2019   NA 137 08/29/2019   K 4.3 08/29/2019   CL 100 08/29/2019   CO2 23 08/29/2019   Lab Results  Component Value Date   ALT 12 08/29/2019   AST 16 08/29/2019   ALKPHOS 64 08/29/2019   BILITOT 1.1 08/29/2019  Lab Results  Component Value Date   HGBA1C 5.3 08/29/2019   Lab Results  Component Value Date   INSULIN 24.9 08/29/2019   Lab Results  Component Value Date   TSH 1.720 08/29/2019   Lab Results  Component Value Date   CHOL 202 (H) 08/29/2019   HDL 58 08/29/2019   LDLCALC 129 (H) 08/29/2019   TRIG 82 08/29/2019    CHOLHDL 3.5 08/29/2019   Lab Results  Component Value Date   WBC 8.4 08/29/2019   HGB 13.8 08/29/2019   HCT 41.3 08/29/2019   MCV 88 08/29/2019   PLT 265 08/29/2019   Attestation Statements:   Reviewed by clinician on day of visit: allergies, medications, problem list, medical history, surgical history, family history, social history, and previous encounter notes.  Time spent on visit including pre-visit chart review and post-visit care and charting was 28 minutes.   I, Insurance claims handler, CMA, am acting as Energy manager for William Hamburger, NP.  I have reviewed the above documentation for accuracy and completeness, and I agree with the above. -  Julaine Fusi, NP

## 2019-11-18 ENCOUNTER — Telehealth (INDEPENDENT_AMBULATORY_CARE_PROVIDER_SITE_OTHER): Payer: BC Managed Care – PPO | Admitting: Psychology

## 2019-11-18 DIAGNOSIS — F5089 Other specified eating disorder: Secondary | ICD-10-CM

## 2019-11-18 DIAGNOSIS — F419 Anxiety disorder, unspecified: Secondary | ICD-10-CM

## 2019-11-18 NOTE — Progress Notes (Signed)
  Office: [phone]  /  Fax: [phone]    Date: December 02, 2019   Appointment Start Time: 3:58pm Duration: 33 minutes Provider: Lawerance Cruel, Psy.D. Type of Session: Individual Therapy  Location of Patient: Work Location of Provider: Provider's Home Type of Contact: Telepsychological Visit via MyChart Video Visit  Session Content: Carla Watson is a 39 y.o. female presenting for a follow-up appointment to address the previously established treatment goal of increasing coping skills. Today's appointment was a telepsychological visit due to COVID-19. [name] provided verbal consent for today's telepsychological appointment and she is aware she is responsible for securing confidentiality on her end of the session. Prior to proceeding with today's appointment, [name]'s physical location at the time of this appointment was obtained as well a phone number she could be reached at in the event of technical difficulties. [name] and this provider participated in today's telepsychological service.   This provider conducted a brief check-in. [name] shared she has been busy with work, noting last week was particularly stressful. Regarding eating, [name] discussed trying to eat on plan, but acknowledged stress "created less of a priority" to focus on her eating habits. Psychoeducation regarding the importance of self-care utilizing the oxygen mask metaphor was provided. Additionally, psychoeducation regarding pleasurable activities, including its impact on emotional eating and overall well-being was provided. [name] was provided with a handout with various options of pleasurable activities, and was encouraged to engage in one activity a day and additional activities as needed when triggered to emotionally eat. [name] agreed. [name] provided verbal consent during today's appointment for this provider to send a handout with pleasurable activities via e-mail. [name] was receptive to today's appointment as  evidenced by openness to sharing, responsiveness to feedback, and willingness to engage in pleasurable activities to assist with coping.  Mental Status Examination:  Appearance: well groomed and appropriate hygiene  Behavior: appropriate to circumstances Mood: euthymic Affect: mood congruent Speech: normal in rate, volume, and tone Eye Contact: appropriate Psychomotor Activity: appropriate Gait: unable to assess Thought Process: linear, logical, and goal directed  Thought Content/Perception: no hallucinations, delusions, bizarre thinking or behavior reported or observed and no evidence of suicidal and homicidal ideation, plan, and intent Orientation: time, person, place, and purpose of appointment Memory/Concentration: memory, attention, language, and fund of knowledge intact  Insight/Judgment: fair  Interventions:  Conducted a brief chart review Provided empathic reflections and validation Employed supportive psychotherapy interventions to facilitate reduced distress and to improve coping skills with identified stressors Psychoeducation provided regarding pleasurable activities Psychoeducation provided regarding self-care  DSM-5 Diagnosis(es): 307.59 (F50.8) Other Specified Feeding or Eating Disorder, Emotional Eating Behaviors and 300.00 (F41.9) Unspecified Anxiety Disorder  Treatment Goal & Progress: During the initial appointment with this provider, the following treatment goal was established: increase coping skills. [name] has demonstrated progress in her goal as evidenced by increased awareness of hunger patterns and increased awareness of triggers for emotional eating. [name] also continues to demonstrate willingness to engage in learned skill(s).  Plan: The next appointment will be scheduled in two weeks, which will be via MyChart Video Visit. The next session will focus on working towards the established treatment goal.

## 2019-11-25 ENCOUNTER — Encounter (INDEPENDENT_AMBULATORY_CARE_PROVIDER_SITE_OTHER): Payer: Self-pay | Admitting: Family Medicine

## 2019-11-25 ENCOUNTER — Ambulatory Visit (INDEPENDENT_AMBULATORY_CARE_PROVIDER_SITE_OTHER): Payer: BC Managed Care – PPO | Admitting: Family Medicine

## 2019-11-25 ENCOUNTER — Other Ambulatory Visit: Payer: Self-pay

## 2019-11-25 VITALS — BP 117/68 | HR 59 | Temp 98.3°F | Ht 67.0 in | Wt 259.0 lb

## 2019-11-25 DIAGNOSIS — E88819 Insulin resistance, unspecified: Secondary | ICD-10-CM

## 2019-11-25 DIAGNOSIS — E8881 Metabolic syndrome: Secondary | ICD-10-CM

## 2019-11-25 DIAGNOSIS — E559 Vitamin D deficiency, unspecified: Secondary | ICD-10-CM

## 2019-11-25 DIAGNOSIS — Z6841 Body Mass Index (BMI) 40.0 and over, adult: Secondary | ICD-10-CM | POA: Diagnosis not present

## 2019-11-25 MED ORDER — VITAMIN D (ERGOCALCIFEROL) 1.25 MG (50000 UNIT) PO CAPS: 50000.0000 [IU] | ORAL_CAPSULE | ORAL | 0 refills | Status: DC

## 2019-11-25 NOTE — Progress Notes (Signed)
Chief Complaint:   OBESITY Carla Watson is here to discuss her progress with her obesity treatment plan along with follow-up of her obesity related diagnoses. Carla Watson is on the Category 2 Plan and states she is following her eating plan approximately 25% of the time. Carla Watson states she is doing boot camp for 45 minutes 3-4 times per week.  Today's visit was #: 7 Starting weight: 267 lbs Starting date: 08/29/2019 Today's weight: 259 lbs Today's date: 11/25/2019 Total lbs lost to date: 8 Total lbs lost since last in-office visit: 0  Interim History: Carla Watson recently returned from a trip to [place] and was off the plan. She is up 2 lbs today. Her hunger is satisfied when she eats the protein on the plan. She is now weighing her protein. She tends to eat out at supper which sometimes leads to poor choices.  Subjective:   1. Insulin resistance Carla Watson has a diagnosis of insulin resistance based on her elevated fasting insulin level >5. Carla Watson struggles with hunger between 3-4 pm. She is not on metformin, and we discussed metformin briefly.   Lab Results  Component Value Date   INSULIN 24.9 08/29/2019   Lab Results  Component Value Date   HGBA1C 5.3 08/29/2019   2. Vitamin D deficiency Carla Watson's last Vit D level was low at 25.7. She is on weekly prescription Vit D.  Assessment/Plan:   1. Insulin resistance  She declined metformin today. She is to have a snack on her way home from work.  2. Vitamin D deficiency  We will refill prescription Vitamin D for 1 month.   - Vitamin D, Ergocalciferol, (DRISDOL) 1.25 MG (50000 UNIT) CAPS capsule; Take 1 capsule (50,000 Units total) by mouth every 7 (seven) days.  Dispense: 4 capsule; Refill: 0  3. Class 3 severe obesity with serious comorbidity and body mass index (BMI) of 40.0 to 44.9 in adult, unspecified obesity type Carla Watson) Carla Watson is currently in the action stage of change. As such, her goal is to continue with weight loss efforts.  She has agreed to the Category 2 Plan.   Handout given today: Protein Exchanges. I discussed how to move food around on the plan.  Exercise goals: As is.  Behavioral modification strategies: increasing lean protein intake and meal planning and cooking strategies.  Carla Watson has agreed to follow-up with our clinic in 2 weeks.  Objective:   Blood pressure 117/68, pulse (!) 59, temperature 98.3 F (36.8 C), temperature source Oral, height 5\' 7"  (1.702 m), weight 259 lb (117.5 kg), SpO2 98 %. Body mass index is 40.57 kg/m.  General: Cooperative, alert, well developed, in no acute distress. HEENT: Conjunctivae and lids unremarkable. Cardiovascular: Regular rhythm.  Lungs: Normal work of breathing. Neurologic: No focal deficits.   Lab Results  Component Value Date   CREATININE 0.67 08/29/2019   BUN 14 08/29/2019   NA 137 08/29/2019   K 4.3 08/29/2019   CL 100 08/29/2019   CO2 23 08/29/2019   Lab Results  Component Value Date   ALT 12 08/29/2019   AST 16 08/29/2019   ALKPHOS 64 08/29/2019   BILITOT 1.1 08/29/2019   Lab Results  Component Value Date   HGBA1C 5.3 08/29/2019   Lab Results  Component Value Date   INSULIN 24.9 08/29/2019   Lab Results  Component Value Date   TSH 1.720 08/29/2019   Lab Results  Component Value Date   CHOL 202 (H) 08/29/2019   HDL 58 08/29/2019   LDLCALC 129 (  H) 08/29/2019   TRIG 82 08/29/2019   CHOLHDL 3.5 08/29/2019   Lab Results  Component Value Date   WBC 8.4 08/29/2019   HGB 13.8 08/29/2019   HCT 41.3 08/29/2019   MCV 88 08/29/2019   PLT 265 08/29/2019   No results found for: IRON, TIBC, FERRITIN  Attestation Statements:   Reviewed by clinician on day of visit: allergies, medications, problem list, medical history, surgical history, family history, social history, and previous encounter notes.   Trude Mcburney, am acting as Energy manager for [place], FNP-C.  I have reviewed the above documentation for  accuracy and completeness, and I agree with the above. -  Jesse Sans, FNP

## 2019-11-26 ENCOUNTER — Encounter (INDEPENDENT_AMBULATORY_CARE_PROVIDER_SITE_OTHER): Payer: Self-pay | Admitting: Family Medicine

## 2019-12-02 ENCOUNTER — Telehealth (INDEPENDENT_AMBULATORY_CARE_PROVIDER_SITE_OTHER): Payer: BC Managed Care – PPO | Admitting: Psychology

## 2019-12-02 DIAGNOSIS — F5089 Other specified eating disorder: Secondary | ICD-10-CM | POA: Diagnosis not present

## 2019-12-02 DIAGNOSIS — F419 Anxiety disorder, unspecified: Secondary | ICD-10-CM

## 2019-12-02 NOTE — Progress Notes (Addendum)
Office: [phone]  /  Fax: [phone]    Date: December 16, 2019   Appointment Start Time: 12:30pm Duration: 23 minutes Provider: Lawerance Cruel, Psy.D. Type of Session: Individual Therapy  Location of Patient: Home Location of Provider: Provider's Home Type of Contact: Telepsychological Visit via MyChart Video Visit  Session Content: Akari is a 39 y.o. female presenting for a follow-up appointment to address the previously established treatment goal of increasing coping skills. Today's appointment was a telepsychological visit due to COVID-19. Maghen provided verbal consent for today's telepsychological appointment and she is aware she is responsible for securing confidentiality on her end of the session. Prior to proceeding with today's appointment, Jamise's physical location at the time of this appointment was obtained as well a phone number she could be reached at in the event of technical difficulties. Damia and this provider participated in today's telepsychological service.   Of note, today's appointment was switched to a regular telephone call at 12:40pm with Laretha's verbal consent due to audio issues.  This provider conducted a brief check-in. Jhoanna shared she has been busy with work, noting she recently traveled to see her parents. She discussed an improvement in her eating habits, adding she experienced challenges at work when treats were brought in. Nevertheless, [name] reported she is focusing on making better choices and engaging in portion control. She denied engaging in binge eating behaviors in the past couple weeks, which she attributed to improved mood and routine. Positive reinforcement was provided. Session focused further on mindfulness to assist with coping. She discussed engaging in shared exercises. Positive reinforcement was provided. Rylinn was led through a mindfulness exercise (A Taste of Mindfulness) and her experience was processed. Dorinda provided  verbal consent during today's appointment for this provider to send the handout for today's exercises via e-mail. This provider also discussed the utilization of YouTube for mindfulness exercises (e.g., exercises by Rhae Hammock). Sukhman was receptive to today's appointment as evidenced by openness to sharing, responsiveness to feedback, and willingness to continue engaging in mindfulness exercises.  Mental Status Examination:  Appearance: well groomed and appropriate hygiene  Behavior: appropriate to circumstances Mood: euthymic Affect: mood congruent Speech: normal in rate, volume, and tone Eye Contact: appropriate Psychomotor Activity: appropriate Gait: unable to assess Thought Process: linear, logical, and goal directed  Thought Content/Perception: no hallucinations, delusions, bizarre thinking or behavior reported or observed and no evidence of suicidal and homicidal ideation, plan, and intent Orientation: time, person, place, and purpose of appointment Memory/Concentration: memory, attention, language, and fund of knowledge intact  Insight/Judgment: good  Interventions:  Conducted a brief chart review Provided empathic reflections and validation Reviewed content from the previous session Provided positive reinforcement Employed supportive psychotherapy interventions to facilitate reduced distress and to improve coping skills with identified stressors Engaged patient in mindfulness exercise(s) Employed acceptance and commitment interventions to emphasize mindfulness and acceptance without struggle  DSM-5 Diagnosis(es): 307.59 (F50.8) Other Specified Feeding or Eating Disorder, Emotional and Binge Eating Behaviors and 300.00 (F41.9) Unspecified Anxiety Disorder  Treatment Goal & Progress: During the initial appointment with this provider, the following treatment goal was established: increase coping skills. Zelia has demonstrated progress in her goal as evidenced by increased  awareness of hunger patterns and increased awareness of triggers for emotional eating. Maritssa also continues to demonstrate willingness to engage in learned skill(s).  Plan: Tiamarie was receptive to scheduling an additional follow-up appointment and acknowledged understanding that this provider may be out sooner for maternity leave that anticipated. She added she  will initiate therapeutic services with another provider if she feels it is needed when this provider is out of the office. At this time, the next appointment will be scheduled in 1-2 weeks, which will be via MyChart Video Visit. The next session will focus on working towards the established treatment goal and termination.

## 2019-12-09 ENCOUNTER — Ambulatory Visit (INDEPENDENT_AMBULATORY_CARE_PROVIDER_SITE_OTHER): Payer: BC Managed Care – PPO | Admitting: Family Medicine

## 2019-12-16 ENCOUNTER — Telehealth (INDEPENDENT_AMBULATORY_CARE_PROVIDER_SITE_OTHER): Payer: BC Managed Care – PPO | Admitting: Psychology

## 2019-12-16 ENCOUNTER — Other Ambulatory Visit: Payer: Self-pay

## 2019-12-16 ENCOUNTER — Ambulatory Visit (INDEPENDENT_AMBULATORY_CARE_PROVIDER_SITE_OTHER): Payer: BC Managed Care – PPO | Admitting: Family Medicine

## 2019-12-16 ENCOUNTER — Encounter (INDEPENDENT_AMBULATORY_CARE_PROVIDER_SITE_OTHER): Payer: Self-pay | Admitting: Family Medicine

## 2019-12-16 VITALS — BP 117/75 | HR 83 | Temp 98.1°F | Ht 67.0 in | Wt 259.0 lb

## 2019-12-16 DIAGNOSIS — Z9189 Other specified personal risk factors, not elsewhere classified: Secondary | ICD-10-CM | POA: Diagnosis not present

## 2019-12-16 DIAGNOSIS — E559 Vitamin D deficiency, unspecified: Secondary | ICD-10-CM | POA: Diagnosis not present

## 2019-12-16 DIAGNOSIS — F5089 Other specified eating disorder: Secondary | ICD-10-CM

## 2019-12-16 DIAGNOSIS — E8881 Metabolic syndrome: Secondary | ICD-10-CM

## 2019-12-16 DIAGNOSIS — Z6841 Body Mass Index (BMI) 40.0 and over, adult: Secondary | ICD-10-CM

## 2019-12-16 DIAGNOSIS — F419 Anxiety disorder, unspecified: Secondary | ICD-10-CM | POA: Diagnosis not present

## 2019-12-16 MED ORDER — SAXENDA 18 MG/3ML ~~LOC~~ SOPN: 3.0000 mg | PEN_INJECTOR | Freq: Every day | SUBCUTANEOUS | 0 refills | Status: DC

## 2019-12-16 MED ORDER — BD PEN NEEDLE NANO 2ND GEN 32G X 4 MM MISC: 0 refills | Status: DC

## 2019-12-16 NOTE — Progress Notes (Signed)
Office: [phone]  /  Fax: [phone]    Date: December 26, 2019   Appointment Start Time: 10:07am Duration: 28 minutes Provider: Lawerance Cruel, Psy.D. Type of Session: Individual Therapy  Location of Patient: Home Location of Provider: Provider's Home Type of Contact: Telepsychological Visit via MyChart Video Visit  Session Content: Carla Watson is a 39 y.o. female presenting for a follow-up appointment to address the previously established treatment goal of increasing coping skills. Today's appointment was a telepsychological visit due to COVID-19. Carla Watson provided verbal consent for today's telepsychological appointment and she is aware she is responsible for securing confidentiality on her end of the session. Prior to proceeding with today's appointment, Carla Watson's physical location at the time of this appointment was obtained as well a phone number she could be reached at in the event of technical difficulties. Carla Watson and this provider participated in today's telepsychological service.   This provider conducted a brief check-in. Carla Watson shared "Everything is going really well." She noted an improvement in her eating habits, noting she is making better choices and engaging in portion control. She also described a reduction in emotional eating. Positive reinforcement was provided. Additionally, psychoeducation regarding making better choices and engaging in portion control during the holidays/celebrations was provided. More specifically, this provider discussed the following strategies: coming to meals hungry, but not starving; managing portion sizes; not completely depriving yourself; making the plate colorful (e.g., vegetables); pacing yourself (e.g., waiting 10 minutes before going back for seconds); taking advantage of the nutritious foods; practicing mindfulness; staying hydrated; and avoid bringing home leftovers. Moreover, reviewed mindfulness to assist with moments of anxiety. This  provider also recommended longer-term therapeutic services and Carla Watson provided verbal consent for this provider to place a referral to address symptoms of anxiety and coping skills. Carla Watson was receptive to today's appointment as evidenced by openness to sharing, responsiveness to feedback, and willingness to implement discussed strategies .  Mental Status Examination:  Appearance: well groomed and appropriate hygiene  Behavior: appropriate to circumstances Mood: euthymic Affect: mood congruent Speech: normal in rate, volume, and tone Eye Contact: appropriate Psychomotor Activity: appropriate Gait: unable to assess Thought Process: linear, logical, and goal directed  Thought Content/Perception: no hallucinations, delusions, bizarre thinking or behavior reported or observed and no evidence of suicidal and homicidal ideation, plan, and intent Orientation: time, person, place, and purpose of appointment Memory/Concentration: memory, attention, language, and fund of knowledge intact  Insight/Judgment: good  Interventions:  Conducted a brief chart review Provided empathic reflections and validation Provided positive reinforcement Employed supportive psychotherapy interventions to facilitate reduced distress and to improve coping skills with identified stressors Discussed strategies for holidays/celebrations  Discussed longer-term therapeutic services   DSM-5 Diagnosis(es): 307.59 (F50.8) Other Specified Feeding or Eating Disorder, Emotional and Binge Eating Behaviors and 300.00 (F41.9) Unspecified Anxiety Disorder  Treatment Goal & Progress: During the initial appointment with this provider, the following treatment goal was established: increase coping skills. Carla Watson demonstrated progress in her goal as evidenced by increased awareness of hunger patterns, increased awareness of triggers for emotional eating and reduction in emotional eating. [name] also continues to demonstrate willingness  to engage in learned skill(s).  Plan: Today was Carla Watson's last appointment with this provider. She acknowledged understanding that she may request a follow-up appointment with this provider in the future (following this provider's maternity leave as previously discussed) as long as she is still established with the clinic. A referral will be placed with [facility]. No further follow-up planned by this provider.

## 2019-12-16 NOTE — Progress Notes (Signed)
Chief Complaint:   OBESITY Carla Watson is here to discuss her progress with her obesity treatment plan along with follow-up of her obesity related diagnoses. Carla Watson is on the Category 2 Plan and states she is following her eating plan approximately 60% of the time. Carla Watson states she is doing boot camp for 45 minutes 3-4 times per week.  Today's visit was #: 8 Starting weight: 267 lbs Starting date: 08/29/2019 Today's weight: 259 lbs Today's date: 12/16/2019 Total lbs lost to date: 8 Total lbs lost since last in-office visit: 0  Interim History: Carla Watson says she does worse on the plan at work due to stress and extra snacks at work. She does not always allow herself enough time to eat lunch. She says she can make more time for lunch.  Subjective:   1. Insulin resistance [name] has a diagnosis of insulin resistance based on her elevated fasting insulin level >5. She notes polyphagia, and last A1c was 5.3.   Lab Results  Component Value Date   INSULIN 24.9 08/29/2019   Lab Results  Component Value Date   HGBA1C 5.3 08/29/2019   2. Vitamin D deficiency Carla Watson's Vit D level was low at 25.7. She is on weekly prescription Vit D.  3. At risk for adverse drug reaction Carla Watson is at risk for drug side effects due to start of Saxenda.  Assessment/Plan:   1. Insulin resistance  [name] agreed to start Saxenda, and agreed to follow-up with [name] as directed to closely monitor her progress.  2. Vitamin D deficiency  Carla Watson agreed to continue taking prescription Vitamin D 50,000 IU every week and will follow-up for routine testing of Vitamin D, at least 2-3 times per year to avoid over-replacement.  3. At risk for adverse drug reaction Carla Watson was given approximately 15 minutes of drug side effect counseling today due to chance of nausea and constipation from start of Saxenda.  We discussed side effect possibility and risk versus benefits. Carla Watson agreed to the medication and will  contact this office if these side effects are intolerable.  Repetitive spaced learning was employed today to elicit superior memory formation and behavioral change.  4. Class 3 severe obesity with serious comorbidity and body mass index (BMI) of 40.0 to 44.9 in adult, unspecified obesity type [facility]) [name] is currently in the action stage of change. As such, her goal is to continue with weight loss efforts. She has agreed to the Category 2 Plan.   We discussed various medication options to help Carla Watson with her weight loss efforts and we both agreed to start Saxenda 3.0 mg SubQ daily with no refills, and nano needles #100 with no refills. She denies personal or family history of thyroid cancer, history of pancreatitis, or current cholelithiasis. She was informed of side effects. Patient will start at 0.3 mg SQ daily for 3-4 days and then advance to 0.6 mg SQ daily if no nausea.   - Liraglutide -Weight Management (SAXENDA) 18 MG/3ML SOPN; Inject 3 mg into the skin daily.  Dispense: 15 mL; Refill: 0 - Insulin Pen Needle (BD PEN NEEDLE NANO 2ND GEN) 32G X 4 MM MISC; Use 1 needle daily to inject Saxenda.  Dispense: 100 each; Refill: 0  Carla Watson will make more time for lunch. She may use meal prep service at dinner.  Exercise goals: As is.  Behavioral modification strategies: increasing lean protein intake, decreasing simple carbohydrates and no skipping meals.  Carla Watson has agreed to follow-up with our clinic in 2 weeks.  Objective:   Blood pressure 117/75, pulse 83, temperature 98.1 F (36.7 C), height 5\' 7"  (1.702 m), weight 259 lb (117.5 kg), SpO2 96 %. Body mass index is 40.57 kg/m.  General: Cooperative, alert, well developed, in no acute distress. HEENT: Conjunctivae and lids unremarkable. Cardiovascular: Regular rhythm.  Lungs: Normal work of breathing. Neurologic: No focal deficits.   Lab Results  Component Value Date   CREATININE 0.67 08/29/2019   BUN 14 08/29/2019   NA 137  08/29/2019   K 4.3 08/29/2019   CL 100 08/29/2019   CO2 23 08/29/2019   Lab Results  Component Value Date   ALT 12 08/29/2019   AST 16 08/29/2019   ALKPHOS 64 08/29/2019   BILITOT 1.1 08/29/2019   Lab Results  Component Value Date   HGBA1C 5.3 08/29/2019   Lab Results  Component Value Date   INSULIN 24.9 08/29/2019   Lab Results  Component Value Date   TSH 1.720 08/29/2019   Lab Results  Component Value Date   CHOL 202 (H) 08/29/2019   HDL 58 08/29/2019   LDLCALC 129 (H) 08/29/2019   TRIG 82 08/29/2019   CHOLHDL 3.5 08/29/2019   Lab Results  Component Value Date   WBC 8.4 08/29/2019   HGB 13.8 08/29/2019   HCT 41.3 08/29/2019   MCV 88 08/29/2019   PLT 265 08/29/2019   No results found for: IRON, TIBC, FERRITIN  Attestation Statements:   Reviewed by clinician on day of visit: allergies, medications, problem list, medical history, surgical history, family history, social history, and previous encounter notes.   08/31/2019, am acting as Trude Mcburney for Energy manager, FNP-C.  I have reviewed the above documentation for accuracy and completeness, and I agree with the above. -  [place], FNP

## 2019-12-17 ENCOUNTER — Encounter (INDEPENDENT_AMBULATORY_CARE_PROVIDER_SITE_OTHER): Payer: Self-pay | Admitting: Family Medicine

## 2019-12-17 ENCOUNTER — Encounter (INDEPENDENT_AMBULATORY_CARE_PROVIDER_SITE_OTHER): Payer: Self-pay

## 2019-12-26 ENCOUNTER — Telehealth (INDEPENDENT_AMBULATORY_CARE_PROVIDER_SITE_OTHER): Payer: BC Managed Care – PPO | Admitting: Psychology

## 2019-12-26 DIAGNOSIS — F419 Anxiety disorder, unspecified: Secondary | ICD-10-CM | POA: Diagnosis not present

## 2019-12-26 DIAGNOSIS — F5089 Other specified eating disorder: Secondary | ICD-10-CM | POA: Diagnosis not present

## 2019-12-30 ENCOUNTER — Encounter (INDEPENDENT_AMBULATORY_CARE_PROVIDER_SITE_OTHER): Payer: Self-pay | Admitting: Family Medicine

## 2019-12-30 ENCOUNTER — Other Ambulatory Visit: Payer: Self-pay

## 2019-12-30 ENCOUNTER — Ambulatory Visit (INDEPENDENT_AMBULATORY_CARE_PROVIDER_SITE_OTHER): Payer: BC Managed Care – PPO | Admitting: Family Medicine

## 2019-12-30 VITALS — BP 119/74 | HR 64 | Temp 97.9°F | Ht 67.0 in | Wt 257.0 lb

## 2019-12-30 DIAGNOSIS — Z9189 Other specified personal risk factors, not elsewhere classified: Secondary | ICD-10-CM | POA: Diagnosis not present

## 2019-12-30 DIAGNOSIS — F3289 Other specified depressive episodes: Secondary | ICD-10-CM | POA: Diagnosis not present

## 2019-12-30 DIAGNOSIS — E559 Vitamin D deficiency, unspecified: Secondary | ICD-10-CM

## 2019-12-30 DIAGNOSIS — E66813 Obesity, class 3: Secondary | ICD-10-CM

## 2019-12-30 DIAGNOSIS — Z6841 Body Mass Index (BMI) 40.0 and over, adult: Secondary | ICD-10-CM

## 2019-12-30 MED ORDER — VITAMIN D (ERGOCALCIFEROL) 1.25 MG (50000 UNIT) PO CAPS: 50000.0000 [IU] | ORAL_CAPSULE | ORAL | 0 refills | Status: DC

## 2020-01-01 ENCOUNTER — Encounter (INDEPENDENT_AMBULATORY_CARE_PROVIDER_SITE_OTHER): Payer: Self-pay | Admitting: Family Medicine

## 2020-01-01 NOTE — Progress Notes (Signed)
Chief Complaint:   OBESITY Carla Watson is here to discuss her progress with her obesity treatment plan along with follow-up of her obesity related diagnoses. Carla Watson is on the Category 2 Plan and states she is following her eating plan approximately 70% of the time. Carla Watson states she is doing boot camp for 45 minutes 3-4 times per week.  Today's visit was #: 9 Starting weight: 267 lbs Starting date: 08/29/2019 Today's weight: 257 lbs Today's date: 12/30/2019 Total lbs lost to date: 10 Total lbs lost since last in-office visit: 2  Interim History: Carla Watson was started on Saxenda at her last office visit, and she is tolerating it well. She feels it may be helping. Her cravings are less and satiety has increased. She is on Saxenda 0.6 mg daily. Adherence to plan is fairly good.  Subjective:   1. Vitamin D deficiency Carla Watson's last Vit D level was at 25. She is on weekly prescription Vit D.  2. Other depression, with emotional eating Carla Watson has seen Dr. Dewaine Conger several times for emotional and binge eating behaviors. She is on Zoloft 50 mg daily and her mood is stable. She feels her binge type eating has improved.  3. At risk for side effect of medication Carla Watson is at risk for drug side effects due to increased dose of Saxenda.  Assessment/Plan:   1. Vitamin D deficiency We will refill prescription Vitamin D for 1 month.  - Vitamin D, Ergocalciferol, (DRISDOL) 1.25 MG (50000 UNIT) CAPS capsule; Take 1 capsule (50,000 Units total) by mouth every 7 (seven) days.  Dispense: 4 capsule; Refill: 0  2. Other depression, with emotional eating . Sky will see Dr. Dewaine Conger as needed. Dr. Dewaine Conger gave her a referral to see an outside counselor as needed.   3. At risk for side effect of medication Carla Watson was given approximately 15 minutes of drug side effect counseling today due to possible side effects from increased dose of Saxenda.   We discussed side effect possibility and risk versus  benefits. Carla Watson agreed to the medication and will contact this office if these side effects are intolerable.  Repetitive spaced learning was employed today to elicit superior memory formation and behavioral change.  4. Class 3 severe obesity with serious comorbidity and body mass index (BMI) of 40.0 to 44.9 in adult, unspecified obesity type [facility]) Carla Watson is currently in the action stage of change. As such, her goal is to continue with weight loss efforts. She has agreed to the Category 2 Plan or keeping a food journal and adhering to recommended goals of 1200-1300 calories and 80 grams of protein daily.   Handouts were given today: Thanksgiving Tips, and Recipes.  We discussed various medication options to help Carla Watson with her weight loss efforts and we both agreed that she may increase Saxenda to 1.2 mg daily.  Exercise goals: As is.  Behavioral modification strategies: meal planning and cooking strategies and holiday eating strategies .  Carla Watson has agreed to follow-up with our clinic in 2 to 3 weeks.   Objective:   Blood pressure 119/74, pulse 64, temperature 97.9 F (36.6 C), height 5\' 7"  (1.702 m), weight 257 lb (116.6 kg), SpO2 97 %. Body mass index is 40.25 kg/m.  General: Cooperative, alert, well developed, in no acute distress. HEENT: Conjunctivae and lids unremarkable. Cardiovascular: Regular rhythm.  Lungs: Normal work of breathing. Neurologic: No focal deficits.   Lab Results  Component Value Date   CREATININE 0.67 08/29/2019   BUN 14 08/29/2019  NA 137 08/29/2019   K 4.3 08/29/2019   CL 100 08/29/2019   CO2 23 08/29/2019   Lab Results  Component Value Date   ALT 12 08/29/2019   AST 16 08/29/2019   ALKPHOS 64 08/29/2019   BILITOT 1.1 08/29/2019   Lab Results  Component Value Date   HGBA1C 5.3 08/29/2019   Lab Results  Component Value Date   INSULIN 24.9 08/29/2019   Lab Results  Component Value Date   TSH 1.720 08/29/2019   Lab Results    Component Value Date   CHOL 202 (H) 08/29/2019   HDL 58 08/29/2019   LDLCALC 129 (H) 08/29/2019   TRIG 82 08/29/2019   CHOLHDL 3.5 08/29/2019   Lab Results  Component Value Date   WBC 8.4 08/29/2019   HGB 13.8 08/29/2019   HCT 41.3 08/29/2019   MCV 88 08/29/2019   PLT 265 08/29/2019   No results found for: IRON, TIBC, FERRITIN  Attestation Statements:   Reviewed by clinician on day of visit: allergies, medications, problem list, medical history, surgical history, family history, social history, and previous encounter notes.   Trude Mcburney, am acting as Energy manager for [place], FNP-C.  I have reviewed the above documentation for accuracy and completeness, and I agree with the above. -  Jesse Sans, FNP

## 2020-01-02 ENCOUNTER — Encounter (INDEPENDENT_AMBULATORY_CARE_PROVIDER_SITE_OTHER): Payer: Self-pay

## 2020-01-20 ENCOUNTER — Encounter (INDEPENDENT_AMBULATORY_CARE_PROVIDER_SITE_OTHER): Payer: Self-pay | Admitting: Family Medicine

## 2020-01-20 ENCOUNTER — Other Ambulatory Visit: Payer: Self-pay

## 2020-01-20 ENCOUNTER — Ambulatory Visit (INDEPENDENT_AMBULATORY_CARE_PROVIDER_SITE_OTHER): Payer: BC Managed Care – PPO | Admitting: Family Medicine

## 2020-01-20 VITALS — BP 108/66 | HR 80 | Temp 98.4°F | Ht 67.0 in | Wt 258.0 lb

## 2020-01-20 DIAGNOSIS — E559 Vitamin D deficiency, unspecified: Secondary | ICD-10-CM

## 2020-01-20 DIAGNOSIS — Z6841 Body Mass Index (BMI) 40.0 and over, adult: Secondary | ICD-10-CM | POA: Diagnosis not present

## 2020-01-20 DIAGNOSIS — I10 Essential (primary) hypertension: Secondary | ICD-10-CM | POA: Diagnosis not present

## 2020-01-20 DIAGNOSIS — Z9189 Other specified personal risk factors, not elsewhere classified: Secondary | ICD-10-CM | POA: Diagnosis not present

## 2020-01-20 MED ORDER — VITAMIN D (ERGOCALCIFEROL) 1.25 MG (50000 UNIT) PO CAPS: 50000.0000 [IU] | ORAL_CAPSULE | ORAL | 0 refills | Status: DC

## 2020-01-22 ENCOUNTER — Encounter (INDEPENDENT_AMBULATORY_CARE_PROVIDER_SITE_OTHER): Payer: Self-pay | Admitting: Family Medicine

## 2020-01-22 NOTE — Progress Notes (Signed)
Chief Complaint:   OBESITY Carla Watson is here to discuss her progress with her obesity treatment plan along with follow-up of her obesity related diagnoses. Carla Watson is on the Category 2 Plan or keeping a food journal and adhering to recommended goals of 1200-1300 calories and 80 grams of protein and states she is following her eating plan approximately 50% of the time. Carla Watson states she is walking and doing boot camp for 45 minutes 3 times per week.  Today's visit was #: 10 Starting weight: 267 lbs Starting date: 08/29/2019 Today's weight: 258 lbs Today's date: 01/20/2020 Total lbs lost to date: 9 Total lbs lost since last in-office visit: 0  Interim History: Carla Watson notes some personal stress over the past week which has caused her to be off the plan. She is now adhering to the plan better. She is on Saxenda 1.2 mg and she notes some constipation. She denies nausea, and her hunger and cravings are well controlled.  Subjective:   1. Vitamin D deficiency Carla Watson's Vit D level is low at 25.7. She is on weekly prescription Vit D every 7 days.  2. Essential hypertension Carla Watson's blood pressure is well controlled on lisinopril-hydrochlorothiazide. Cardiovascular ROS: no chest pain or dyspnea on exertion.  BP Readings from Last 3 Encounters:  01/20/20 108/66  12/30/19 119/74  12/16/19 117/75   Lab Results  Component Value Date   CREATININE 0.67 08/29/2019   CREATININE 0.73 06/07/2017   CREATININE 0.57 06/07/2017   3. At risk for side effect of medication Carla Watson is at risk for drug side effects due to increased dose of Saxenda.  Assessment/Plan:   1. Vitamin D deficiency . We will refill prescription Vitamin D for 1 month. Carla Watson will follow-up for routine testing of Vitamin D, at least 2-3 times per year to avoid over-replacement.  - Vitamin D, Ergocalciferol, (DRISDOL) 1.25 MG (50000 UNIT) CAPS capsule; Take 1 capsule (50,000 Units total) by mouth every 7 (seven) days.   Dispense: 4 capsule; Refill: 0  2. Essential hypertension Elvi will continue lisinopril-hydrochlorothiazide.  3. At risk for side effect of medication Carla Watson was given approximately 15 minutes of drug side effect counseling today.  We discussed side effect possibility and risk versus benefits. Carla Watson agreed to the medication and will contact this office if these side effects are intolerable.  Repetitive spaced learning was employed today to elicit superior memory formation and behavioral change.  4. Class 3 severe obesity with serious comorbidity and body mass index (BMI) of 40.0 to 44.9 in adult, unspecified obesity type [facility]) Carla Watson is currently in the action stage of change. As such, her goal is to continue with weight loss efforts. She has agreed to the Category 2 Plan or keeping a food journal and adhering to recommended goals of 1200-1300 calories and 80 grams of protein daily.   We discussed various medication options to help Carla Watson with her weight loss efforts and we both agreed to increase dose of Saxenda to 1.8 mg daily. She may go up to 1.5 mg for 3-4 days and then to 1.8 mg if no nausea. She is to use metamucil as needed.  Exercise goals: As is.  Behavioral modification strategies: better snacking choices, holiday eating strategies  and planning for success.  Carla Watson has agreed to follow-up with our clinic in 2 weeks.   Objective:   Blood pressure 108/66, pulse 80, temperature 98.4 F (36.9 C), height 5\' 7"  (1.702 m), weight 258 lb (117 kg), SpO2 98 %. Body mass  index is 40.41 kg/m.  General: Cooperative, alert, well developed, in no acute distress. HEENT: Conjunctivae and lids unremarkable. Cardiovascular: Regular rhythm.  Lungs: Normal work of breathing. Neurologic: No focal deficits.   Lab Results  Component Value Date   CREATININE 0.67 08/29/2019   BUN 14 08/29/2019   NA 137 08/29/2019   K 4.3 08/29/2019   CL 100 08/29/2019   CO2 23 08/29/2019   Lab  Results  Component Value Date   ALT 12 08/29/2019   AST 16 08/29/2019   ALKPHOS 64 08/29/2019   BILITOT 1.1 08/29/2019   Lab Results  Component Value Date   HGBA1C 5.3 08/29/2019   Lab Results  Component Value Date   INSULIN 24.9 08/29/2019   Lab Results  Component Value Date   TSH 1.720 08/29/2019   Lab Results  Component Value Date   CHOL 202 (H) 08/29/2019   HDL 58 08/29/2019   LDLCALC 129 (H) 08/29/2019   TRIG 82 08/29/2019   CHOLHDL 3.5 08/29/2019   Lab Results  Component Value Date   WBC 8.4 08/29/2019   HGB 13.8 08/29/2019   HCT 41.3 08/29/2019   MCV 88 08/29/2019   PLT 265 08/29/2019   No results found for: IRON, TIBC, FERRITIN  Attestation Statements:   Reviewed by clinician on day of visit: allergies, medications, problem list, medical history, surgical history, family history, social history, and previous encounter notes.   Trude Mcburney, am acting as Energy manager for [place], FNP-C.  I have reviewed the above documentation for accuracy and completeness, and I agree with the above. -  Jesse Sans, FNP

## 2020-02-03 ENCOUNTER — Ambulatory Visit: Payer: BC Managed Care – PPO | Admitting: Obstetrics and Gynecology

## 2020-02-05 ENCOUNTER — Other Ambulatory Visit: Payer: Self-pay

## 2020-02-05 ENCOUNTER — Encounter (INDEPENDENT_AMBULATORY_CARE_PROVIDER_SITE_OTHER): Payer: Self-pay | Admitting: Family Medicine

## 2020-02-05 ENCOUNTER — Telehealth (INDEPENDENT_AMBULATORY_CARE_PROVIDER_SITE_OTHER): Payer: BC Managed Care – PPO | Admitting: Family Medicine

## 2020-02-05 DIAGNOSIS — E8881 Metabolic syndrome: Secondary | ICD-10-CM | POA: Diagnosis not present

## 2020-02-05 DIAGNOSIS — Z6841 Body Mass Index (BMI) 40.0 and over, adult: Secondary | ICD-10-CM

## 2020-02-05 NOTE — Progress Notes (Signed)
TeleHealth Visit:  Due to the COVID-19 pandemic, this visit was completed with telemedicine (audio/video) technology to reduce patient and provider exposure as well as to preserve personal protective equipment.   Tiney has verbally consented to this TeleHealth visit. The patient is located at home, the provider is located at the [facility] office. The participants in this visit include the listed provider and patient. The visit was conducted today via telephone.  Clare was unable to use realtime audiovisual technology today and the telehealth visit was conducted via telephone (15 minute call).  Chief Complaint: OBESITY Bethany is here to discuss her progress with her obesity treatment plan along with follow-up of her obesity related diagnoses. Lyndee is on the Category 2 Plan and keeping a food journal and adhering to recommended goals of 1200-1300 calories and 80 grams of protein and states she is following her eating plan approximately 40% of the time. Marg states she is going to the gym for 30-45 minutes 3 times per week and walking the dog for 20-30 minutes 7 times per week.  Today's visit was #: 11 Starting weight: 267 lbs Starting date: 08/29/2019  Interim History: Khia and I are doing a phone visit today because she is ill.  She weighed 260 pounds on her home scale on 02/03/2020 and reports she has gained 1 pound.  She has been consistent with exercise.  She is on Saxenda 1.8 mg (started this dose last week).  She says that constipation is better (using fiber gummies).  She was off plan some last week due to extra holiday food at school.  She is a Runner, broadcasting/film/video.  She says she does better on plan at home than at work.  Subjective:   1. Insulin resistance Appetite well controlled on Saxenda 1.8 mg daily.  Lab Results  Component Value Date   INSULIN 24.9 08/29/2019   Lab Results  Component Value Date   HGBA1C 5.3 08/29/2019   Assessment/Plan:   1. Insulin  resistance Continue 1.8 mg Saxenda subcutaneously daily.  2. Class 3 severe obesity with serious comorbidity and body mass index (BMI) of 40.0 to 44.9 in adult, unspecified obesity type [facility])  Lizzet is currently in the action stage of change. As such, her goal is to continue with weight loss efforts. She has agreed to the Category 2 Plan or keeping a food journal and adhering to recommended goals of 1200-1300 calories and 80 grams of protein daily.   Continue Saxenda at 1.8 mg subcutaneously daily.  Exercise goals: As is.  Behavioral modification strategies: decreasing simple carbohydrates and holiday eating strategies .  Irena has agreed to follow-up with our clinic in 3-4 weeks.  Objective:   VITALS: Per patient if applicable, see vitals. GENERAL: Alert and in no acute distress. CARDIOPULMONARY: No increased WOB. Speaking in clear sentences.  PSYCH: Pleasant and cooperative. Speech normal rate and rhythm. Affect is appropriate. Insight and judgement are appropriate. Attention is focused, linear, and appropriate.  NEURO: Oriented as arrived to appointment on time with no prompting.   Lab Results  Component Value Date   CREATININE 0.67 08/29/2019   BUN 14 08/29/2019   NA 137 08/29/2019   K 4.3 08/29/2019   CL 100 08/29/2019   CO2 23 08/29/2019   Lab Results  Component Value Date   ALT 12 08/29/2019   AST 16 08/29/2019   ALKPHOS 64 08/29/2019   BILITOT 1.1 08/29/2019   Lab Results  Component Value Date   HGBA1C 5.3  08/29/2019   Lab Results  Component Value Date   INSULIN 24.9 08/29/2019   Lab Results  Component Value Date   TSH 1.720 08/29/2019   Lab Results  Component Value Date   CHOL 202 (H) 08/29/2019   HDL 58 08/29/2019   LDLCALC 129 (H) 08/29/2019   TRIG 82 08/29/2019   CHOLHDL 3.5 08/29/2019   Lab Results  Component Value Date   WBC 8.4 08/29/2019   HGB 13.8 08/29/2019   HCT 41.3 08/29/2019   MCV 88 08/29/2019   PLT 265 08/29/2019    Attestation Statements:   Reviewed by clinician on day of visit: allergies, medications, problem list, medical history, surgical history, family history, social history, and previous encounter notes.  I, Insurance claims handler, CMA, am acting as Energy manager for [place], FNP.  I have reviewed the above documentation for accuracy and completeness, and I agree with the above. - Jesse Sans, FNP

## 2020-02-06 ENCOUNTER — Other Ambulatory Visit: Payer: Self-pay

## 2020-02-06 ENCOUNTER — Ambulatory Visit (INDEPENDENT_AMBULATORY_CARE_PROVIDER_SITE_OTHER): Payer: BC Managed Care – PPO | Admitting: Obstetrics and Gynecology

## 2020-02-06 ENCOUNTER — Encounter: Payer: Self-pay | Admitting: Obstetrics and Gynecology

## 2020-02-06 ENCOUNTER — Ambulatory Visit: Payer: BC Managed Care – PPO | Admitting: Obstetrics and Gynecology

## 2020-02-06 VITALS — BP 138/70 | Ht 67.0 in | Wt 267.0 lb

## 2020-02-06 DIAGNOSIS — Z01419 Encounter for gynecological examination (general) (routine) without abnormal findings: Secondary | ICD-10-CM

## 2020-02-06 DIAGNOSIS — Z30431 Encounter for routine checking of intrauterine contraceptive device: Secondary | ICD-10-CM

## 2020-02-06 DIAGNOSIS — Z1239 Encounter for other screening for malignant neoplasm of breast: Secondary | ICD-10-CM | POA: Diagnosis not present

## 2020-02-06 NOTE — Progress Notes (Signed)
Gynecology Annual Exam   PCP: [facility]  Chief Complaint:  Chief Complaint  Patient presents with  . Gynecologic Exam    Annual - no concerns. RM 3    History of Present Illness: Patient is a 39 y.o. G0P0000 presents for annual exam. The patient has no complaints today.   LMP: No LMP recorded. (Menstrual status: IUD). Mostly amenorrhea with occasional light 3 days menses proceeded by moliminal symptoms  The patient is sexually active. She currently uses IUD for contraception. She denies dyspareunia.  The patient does perform self breast exams.  There is no notable family history of breast or ovarian cancer in her family.  The patient wears seatbelts: yes.   The patient has regular exercise: not asked.    The patient denies current symptoms of depression.    Review of Systems: Review of Systems  Constitutional: Negative for chills and fever.  HENT: Negative for congestion.   Respiratory: Negative for cough and shortness of breath.   Cardiovascular: Negative for chest pain and palpitations.  Gastrointestinal: Negative for abdominal pain, constipation, diarrhea, heartburn, nausea and vomiting.  Genitourinary: Negative for dysuria, frequency and urgency.  Skin: Negative for itching and rash.  Neurological: Negative for dizziness and headaches.  Endo/Heme/Allergies: Negative for polydipsia.  Psychiatric/Behavioral: Negative for depression.    Past Medical History:  Patient Active Problem List   Diagnosis Date Noted  . Essential hypertension 10/30/2019  . Vitamin D deficiency 10/10/2019  . Mixed hyperlipidemia 10/10/2019  . Insulin resistance 10/10/2019  . Abnormal cytology 09/08/2017    Overview:  has had 2 normal colposcopies   . 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 Mixed anxiety depressive disorder 05/22/2017  . Class 3 severe obesity with serious comorbidity and body mass index (BMI) of 40.0 to 44.9 in adult [facility]) 01/25/2017    Past Surgical History:   Past Surgical History:  Procedure Laterality Date  . BACK SURGERY  2014  . COLPOSCOPY W/ BIOPSY / CURETTAGE  2015   2016, 2017    Gynecologic History:  No LMP recorded. (Menstrual status: IUD). Contraception: IUD Mirena placement 09/26/2014 Last Pap: Results were:  08/27/2018 NIL and HR HPV negative  04/18/2017 Colposcopy negative 03/27/2017 LGSIL HPV positive      Obstetric History: G0P0000  Family History:  Family History  Problem Relation Age of Onset  . Hypertension Father   . Hyperlipidemia Father   . Sleep apnea Father   . Obesity Father   . COPD Maternal Grandmother   . Brain cancer Paternal Grandfather   . Lung cancer Paternal Grandfather   . Hypertension Paternal Grandfather   . Hypertension Mother   . Obesity Mother     Social History:  Social History   Socioeconomic History  . Marital status: Single    Spouse name: Not on file  . Number of children: Not on file  . Years of education: Not on file  . Highest education level: Not on file  Occupational History  . Occupation: high Engineer, site  Tobacco Use  . Smoking status: Never Smoker  . Smokeless tobacco: Never Used  Vaping Use  . Vaping Use: Never used  Substance and Sexual Activity  . Alcohol use: Yes    Comment: rare  . Drug use: No  . Sexual activity: Not Currently    Partners: Male    Birth control/protection: I.U.D.  Other Topics Concern  . Not on file  Social History Narrative  . Not on file   Social  Determinants of Health   Financial Resource Strain: Not on file  Food Insecurity: Not on file  Transportation Needs: Not on file  Physical Activity: Not on file  Stress: Not on file  Social Connections: Not on file  Intimate Partner Violence: Not on file    Allergies:  Allergies  Allergen Reactions  . Pertussis Vaccines Other (See Comments)    Seizure Disorder   . Tape Rash  . Tapentadol Rash    Medications: Prior to Admission medications   Medication Sig Start Date End  Date Taking? Authorizing Provider  Insulin Pen Needle (BD PEN NEEDLE NANO 2ND GEN) 32G X 4 MM MISC Use 1 needle daily to inject Saxenda. 12/16/19   Whitmire, Thermon Leyland, FNP  levonorgestrel (MIRENA, 52 MG,) 20 MCG/24HR IUD 1 each by Intrauterine route once.     [provider]  Liraglutide -Weight Management (SAXENDA) 18 MG/3ML SOPN Inject 3 mg into the skin daily. 12/16/19   Whitmire, Thermon Leyland, FNP  lisinopril-hydrochlorothiazide (PRINZIDE,ZESTORETIC) 10-12.5 MG tablet Take 1 tablet by mouth daily.  10/26/16 08/29/19  [provider]  loratadine (ALLERGY) 10 MG tablet Take 10 mg by mouth daily.    [provider]  Multiple Vitamin (MULTIVITAMIN ADULT) TABS Take 1 tablet by mouth daily.    [provider]  sertraline (ZOLOFT) 50 MG tablet Take 1 tablet by mouth daily. 05/22/17   [provider]  Vitamin D, Ergocalciferol, (DRISDOL) 1.25 MG (50000 UNIT) CAPS capsule Take 1 capsule (50,000 Units total) by mouth every 7 (seven) days. 01/20/20   Whitmire, Thermon Leyland, FNP    Physical Exam Vitals: Blood pressure 138/70, height 5\' 7"  (1.702 m), weight 267 lb (121.1 kg).   General: NAD HEENT: normocephalic, anicteric Thyroid: no enlargement, no palpable nodules Pulmonary: No increased work of breathing, CTAB Cardiovascular: RRR, distal pulses 2+ Breast: Breast symmetrical, no tenderness, no palpable nodules or masses, no skin or nipple retraction present, no nipple discharge.  No axillary or supraclavicular lymphadenopathy. Abdomen: NABS, soft, non-tender, non-distended.  Umbilicus without lesions.  No hepatomegaly, splenomegaly or masses palpable. No evidence of hernia  Genitourinary:  External: Normal external female genitalia.  Normal urethral meatus, normal Bartholin's and Skene's glands.    Vagina: Normal vaginal mucosa, no evidence of prolapse.    Cervix: Grossly normal in appearance, no bleeding, IUD strings 2cm  Uterus: Non-enlarged, mobile, normal contour.  No  CMT  Adnexa: ovaries non-enlarged, no adnexal masses  Rectal: deferred  Lymphatic: no evidence of inguinal lymphadenopathy Extremities: no edema, erythema, or tenderness Neurologic: Grossly intact Psychiatric: mood appropriate, affect full  Female chaperone present for pelvic and breast  portions of the physical exam    Assessment: 39 y.o. G0P0000 routine annual exam  Plan: Problem List Items Addressed This Visit   None   Visit Diagnoses    Encounter for gynecological examination without abnormal finding    -  Primary   Breast screening       IUD check up          2) STI screening  was notoffered and therefore not obtained  2)  ASCCP guidelines and rational discussed.  Patient opts for every 3 years screening interval  3) Contraception - the patient is currently using  IUD.  She is happy with her current form of contraception and plans to continue.  Per current FDA approval good until 09/25/2021   4) Routine healthcare maintenance including cholesterol, diabetes screening discussed managed by PCP  5) Return in about 1  year (around 02/05/2021) for annual.   Vena Austria, MD, [facility], [facility] 02/06/2020, 10:15 AM

## 2020-02-25 ENCOUNTER — Other Ambulatory Visit: Payer: Self-pay

## 2020-02-25 ENCOUNTER — Encounter (INDEPENDENT_AMBULATORY_CARE_PROVIDER_SITE_OTHER): Payer: Self-pay | Admitting: Family Medicine

## 2020-02-25 ENCOUNTER — Ambulatory Visit (INDEPENDENT_AMBULATORY_CARE_PROVIDER_SITE_OTHER): Payer: BC Managed Care – PPO | Admitting: Family Medicine

## 2020-02-25 VITALS — BP 113/76 | HR 74 | Temp 98.4°F | Ht 67.0 in | Wt 257.0 lb

## 2020-02-25 DIAGNOSIS — I10 Essential (primary) hypertension: Secondary | ICD-10-CM | POA: Diagnosis not present

## 2020-02-25 DIAGNOSIS — Z9189 Other specified personal risk factors, not elsewhere classified: Secondary | ICD-10-CM | POA: Diagnosis not present

## 2020-02-25 DIAGNOSIS — Z6841 Body Mass Index (BMI) 40.0 and over, adult: Secondary | ICD-10-CM | POA: Diagnosis not present

## 2020-02-25 DIAGNOSIS — E559 Vitamin D deficiency, unspecified: Secondary | ICD-10-CM | POA: Diagnosis not present

## 2020-02-25 MED ORDER — VITAMIN D (ERGOCALCIFEROL) 1.25 MG (50000 UNIT) PO CAPS: 50000.0000 [IU] | ORAL_CAPSULE | ORAL | 0 refills | Status: DC

## 2020-02-25 MED ORDER — FIBER ADULT GUMMIES 2 G PO CHEW: 1.0000 | CHEWABLE_TABLET | Freq: Every day | ORAL | 0 refills | Status: DC | PRN

## 2020-02-25 MED ORDER — SAXENDA 18 MG/3ML ~~LOC~~ SOPN: 3.0000 mg | PEN_INJECTOR | Freq: Every day | SUBCUTANEOUS | 0 refills | Status: DC

## 2020-02-27 NOTE — Progress Notes (Signed)
Chief Complaint:   OBESITY Carla Watson is here to discuss her progress with her obesity treatment plan along with follow-up of her obesity related diagnoses. Carla Watson is on the Category 2 Plan and states she is following her eating plan approximately 70% of the time. Carla Watson states she is walking or class 45 minutes 5 times per week.  Today's visit was #: 11 Starting weight: 267 lbs Starting date: 08/29/2019 Today's weight: 257 lbs Today's date: 02/25/2020 Total lbs lost to date: 10 lbs Total lbs lost since last in-office visit: 0  Interim History: Carla Watson gained some weight over Christmas but has since lost it. She is down 1 lb today. She enjoys lifting weights and is doing exercises 5 days per week. She is on 1.8 mg Saxenda daily. Bowel movements are good with fiber gummies and she denies nausea. Carla Watson reports that she used to "binge" and has not binged recently. Carla Watson is hitting calorie and protein goals.  Subjective:   1. Vitamin D deficiency Carla Watson's Vitamin D level is low at 25.7 (08/29/2019). She is currently taking prescription vitamin D 50,000 IU each week.   2. Essential hypertension Review: taking medications as instructed, no medication side effects noted, no chest pain on exertion, no dyspnea on exertion, no swelling of ankles. Blood pressure is well controlled on HCTZ and Zestoretic.  BP Readings from Last 3 Encounters:  02/25/20 113/76  02/06/20 138/70  01/20/20 108/66    3. At risk for constipation Carla Watson is at increased risk for constipation due to inadequate water intake, changes in diet, and/or use of medications such as GLP1 agonists (Saxenda).  Carla Watson is taking [name].    Assessment/Plan:   1. Vitamin D deficiency  She agrees to continue to take prescription Vitamin D @50 ,000 IU every week and will follow-up for routine testing of Vitamin D, at least 2-3 times per year to avoid over-replacement. Check Vitamin D at next office visit. We will refill  Vitamin D 50,000 U weekly #4. No refill.  - Vitamin D, Ergocalciferol, (DRISDOL) 1.25 MG (50000 UNIT) CAPS capsule; Take 1 capsule (50,000 Units total) by mouth every 7 (seven) days.  Dispense: 4 capsule; Refill: 0  2. Essential hypertension Carla Watson will continue all medications.  3. At risk for constipation Carla Watson was given approximately 15 minutes of counseling today regarding prevention of constipation. She was encouraged to increase water and fiber intake.   4. Class 3 severe obesity with serious comorbidity and body mass index (BMI) of 40.0 to 44.9 in adult, unspecified obesity type Carla Watson)  Carla Watson is currently in the action stage of change. As such, her goal is to continue with weight loss efforts. She has agreed to keeping a food journal and adhering to recommended goals of 1200-1300 calories and 80 grams of protein.    Carla -Weight Management (SAXENDA) 18 MG/3ML SOPN; Inject 3 mg into the skin daily.  Dispense: 15 mL; Refill: 0  Exercise goals: As is.  Behavioral modification strategies: increasing lean protein intake and decreasing simple carbohydrates.  Carla Watson has agreed to follow-up with our clinic in 3 weeks.    Objective:   Blood pressure 113/76, pulse 74, temperature 98.4 F (36.9 C), height 5\' 7"  (1.702 m), weight 257 lb (116.6 kg), SpO2 98 %. Body mass index is 40.25 kg/m.  General: Cooperative, alert, well developed, in no acute distress. HEENT: Conjunctivae and lids unremarkable. Cardiovascular: Regular rhythm.  Lungs: Normal work of breathing. Neurologic: No focal deficits.   Lab Results  Component Value  Date   CREATININE 0.67 08/29/2019   BUN 14 08/29/2019   NA 137 08/29/2019   K 4.3 08/29/2019   CL 100 08/29/2019   CO2 23 08/29/2019   Lab Results  Component Value Date   ALT 12 08/29/2019   AST 16 08/29/2019   ALKPHOS 64 08/29/2019   BILITOT 1.1 08/29/2019   Lab Results  Component Value Date   HGBA1C 5.3 08/29/2019   Lab Results   Component Value Date   INSULIN 24.9 08/29/2019   Lab Results  Component Value Date   TSH 1.720 08/29/2019   Lab Results  Component Value Date   CHOL 202 (H) 08/29/2019   HDL 58 08/29/2019   LDLCALC 129 (H) 08/29/2019   TRIG 82 08/29/2019   CHOLHDL 3.5 08/29/2019   Lab Results  Component Value Date   WBC 8.4 08/29/2019   HGB 13.8 08/29/2019   HCT 41.3 08/29/2019   MCV 88 08/29/2019   PLT 265 08/29/2019   No results found for: IRON, TIBC, FERRITIN   Attestation Statements:   Reviewed by clinician on day of visit: allergies, medications, problem list, medical history, surgical history, family history, social history, and previous encounter notes.     Felecia Jan, am acting as Energy manager for Jesse Sans, FNP  I have reviewed the above documentation for accuracy and completeness, and I agree with the above. -  Jesse Sans, FNP

## 2020-03-02 ENCOUNTER — Encounter (INDEPENDENT_AMBULATORY_CARE_PROVIDER_SITE_OTHER): Payer: Self-pay | Admitting: Family Medicine

## 2020-03-18 ENCOUNTER — Ambulatory Visit (INDEPENDENT_AMBULATORY_CARE_PROVIDER_SITE_OTHER): Payer: BC Managed Care – PPO | Admitting: Family Medicine

## 2020-03-18 ENCOUNTER — Encounter (INDEPENDENT_AMBULATORY_CARE_PROVIDER_SITE_OTHER): Payer: Self-pay | Admitting: Family Medicine

## 2020-03-18 ENCOUNTER — Other Ambulatory Visit: Payer: Self-pay

## 2020-03-18 VITALS — BP 115/70 | HR 72 | Temp 98.0°F | Ht 67.0 in | Wt 257.0 lb

## 2020-03-18 DIAGNOSIS — E8881 Metabolic syndrome: Secondary | ICD-10-CM | POA: Diagnosis not present

## 2020-03-18 DIAGNOSIS — Z9189 Other specified personal risk factors, not elsewhere classified: Secondary | ICD-10-CM | POA: Diagnosis not present

## 2020-03-18 DIAGNOSIS — E7849 Other hyperlipidemia: Secondary | ICD-10-CM

## 2020-03-18 DIAGNOSIS — Z6841 Body Mass Index (BMI) 40.0 and over, adult: Secondary | ICD-10-CM

## 2020-03-18 DIAGNOSIS — E88819 Insulin resistance, unspecified: Secondary | ICD-10-CM

## 2020-03-18 DIAGNOSIS — E559 Vitamin D deficiency, unspecified: Secondary | ICD-10-CM | POA: Diagnosis not present

## 2020-03-18 MED ORDER — VITAMIN D (ERGOCALCIFEROL) 1.25 MG (50000 UNIT) PO CAPS: 50000.0000 [IU] | ORAL_CAPSULE | ORAL | 0 refills | Status: DC

## 2020-03-18 MED ORDER — BD PEN NEEDLE NANO 2ND GEN 32G X 4 MM MISC: 0 refills | Status: DC

## 2020-03-19 LAB — VITAMIN D 25 HYDROXY (VIT D DEFICIENCY, FRACTURES): Vit D, 25-Hydroxy: 53.2 ng/mL (ref 30.0–100.0)

## 2020-03-19 LAB — HEMOGLOBIN A1C
Est. average glucose Bld gHb Est-mCnc: 103 mg/dL
Hgb A1c MFr Bld: 5.2 % (ref 4.8–5.6)

## 2020-03-19 LAB — LIPID PANEL WITH LDL/HDL RATIO
Cholesterol, Total: 177 mg/dL (ref 100–199)
HDL: 53 mg/dL (ref >39)
LDL Chol Calc (NIH): 108 mg/dL — ABNORMAL HIGH (ref 0–99)
LDL/HDL Ratio: 2 ratio (ref 0.0–3.2)
Triglycerides: 84 mg/dL (ref 0–149)
VLDL Cholesterol Cal: 16 mg/dL (ref 5–40)

## 2020-03-19 LAB — COMPREHENSIVE METABOLIC PANEL
ALT: 13 IU/L (ref 0–32)
AST: 20 IU/L (ref 0–40)
Albumin/Globulin Ratio: 1.6 (ref 1.2–2.2)
Albumin: 4.4 g/dL (ref 3.8–4.8)
Alkaline Phosphatase: 65 IU/L (ref 44–121)
BUN/Creatinine Ratio: 18 (ref 9–23)
BUN: 14 mg/dL (ref 6–20)
Bilirubin Total: 1 mg/dL (ref 0.0–1.2)
CO2: 26 mmol/L (ref 20–29)
Calcium: 9.3 mg/dL (ref 8.7–10.2)
Chloride: 98 mmol/L (ref 96–106)
Creatinine, Ser: 0.78 mg/dL (ref 0.57–1.00)
GFR calc Af Amer: 112 mL/min/{1.73_m2} (ref >59)
GFR calc non Af Amer: 97 mL/min/{1.73_m2} (ref >59)
Globulin, Total: 2.7 g/dL (ref 1.5–4.5)
Glucose: 83 mg/dL (ref 65–99)
Potassium: 3.9 mmol/L (ref 3.5–5.2)
Sodium: 142 mmol/L (ref 134–144)
Total Protein: 7.1 g/dL (ref 6.0–8.5)

## 2020-03-19 LAB — INSULIN, RANDOM: INSULIN: 23.1 u[IU]/mL (ref 2.6–24.9)

## 2020-03-19 NOTE — Progress Notes (Signed)
Chief Complaint:   OBESITY Carla Watson is here to discuss her progress with her obesity treatment plan along with follow-up of her obesity related diagnoses. Carla Watson is on keeping a food journal and adhering to recommended goals of 1200-1300 calories and 80 grams of protein daily and states she is following her eating plan approximately 70% of the time. Carla Watson states she is walking and doing boot camp for 30-45 minutes 3 times per week.  Today's visit was #: 12 Starting weight: 267 lbs Starting date: 08/29/2019 Today's weight: 257 lbs Today's date: 03/18/2020 Total lbs lost to date: 10 Total lbs lost since last in-office visit: 0  Interim History: Carla Watson is a bit disappointed that she didn't lose wt today. She feels she has been on the plan fairly well, but she is not journaling consistently. She feels she has an "all or nothing" mentality. She is on 1.8 mg of Saxenda. She notes she is struggling with motivation.  Subjective:   1. Vitamin D deficiency Carla Watson's last Vit D level was low at 25.7 on 08/29/2019. She is on prescription Vit D.  2. Insulin resistance Carla Watson notes some hunger and cravings. Last A1c was 5.3. She is on Saxenda for appetite suppression.   Lab Results  Component Value Date   INSULIN 23.1 03/18/2020   INSULIN 24.9 08/29/2019   Lab Results  Component Value Date   HGBA1C 5.2 03/18/2020   3. Other hyperlipidemia Carla Watson's last LDL was elevated at 129, and triglycerides and HDL were within normal limits. She is not on statin.   Lab Results  Component Value Date   ALT 13 03/18/2020   AST 20 03/18/2020   ALKPHOS 65 03/18/2020   BILITOT 1.0 03/18/2020   Lab Results  Component Value Date   CHOL 177 03/18/2020   HDL 53 03/18/2020   LDLCALC 108 (H) 03/18/2020   TRIG 84 03/18/2020   CHOLHDL 3.5 08/29/2019   4. At risk for side effect of medication Carla Watson is at risk for drug side effects due to increased dose of Saxenda.  Assessment/Plan:   1.  Vitamin D deficiency  We will check labs today, and we will refill prescription Vitamin D for 1 month. Carla Watson will follow-up for routine testing of Vitamin D, at least 2-3 times per year to avoid over-replacement.  - VITAMIN D 25 Hydroxy (Vit-D Deficiency, Fractures) - Vitamin D, Ergocalciferol, (DRISDOL) 1.25 MG (50000 UNIT) CAPS capsule; Take 1 capsule (50,000 Units total) by mouth every 7 (seven) days.  Dispense: 4 capsule; Refill: 0  2. Insulin resistance Carla Watson will continue to work on weight loss, exercise, and decreasing simple carbohydrates to help decrease the risk of diabetes. We will check labs today. Carla Watson agreed to follow-up with [name] as directed to closely monitor her progress.  - Hemoglobin A1c - Insulin, random - Comprehensive metabolic panel  3. Other hyperlipidemia . We discussed several lifestyle modifications today. Carla Watson will continue to work on diet, exercise and weight loss efforts. We will check labs today.   - Lipid Panel With LDL/HDL Ratio  4. At risk for side effect of medication Carla Watson was given approximately 15 minutes of diabetes education and counseling today. We discussed intensive lifestyle modifications today with an emphasis on weight loss as well as increasing exercise and decreasing simple carbohydrates in her diet. We also reviewed medication options with an emphasis on risk versus benefit of those discussed.   Repetitive spaced learning was employed today to elicit superior memory formation and behavioral change.  5. Class 3 severe obesity with serious comorbidity and body mass index (BMI) of 40.0 to 44.9 in adult, unspecified obesity type Carla Watson) Carla Watson is currently in the action stage of change. As such, her goal is to continue with weight loss efforts. She has agreed to change back to the Category 2 Plan for more structure.   We both agreed to increase Saxenda to 2.4 mg daily, and we will refill pen needles #100 with no refills.  - Insulin Pen  Needle (BD PEN NEEDLE NANO 2ND GEN) 32G X 4 MM MISC; Use 1 needle daily to inject Saxenda.  Dispense: 100 each; Refill: 0  Exercise goals: As is.  Behavioral modification strategies: increasing lean protein intake, decreasing simple carbohydrates and planning for success.  Carla Watson has agreed to follow-up with our clinic in 2 to 3 weeks. She was informed of the importance of frequent follow-up visits to maximize her success with intensive lifestyle modifications for her multiple health conditions.   Carla Watson was informed we would discuss her lab results at her next visit unless there is a critical issue that needs to be addressed sooner. Carla Watson agreed to keep her next visit at the agreed upon time to discuss these results.  Objective:   Blood pressure 115/70, pulse 72, temperature 98 F (36.7 C), height 5\' 7"  (1.702 m), weight 257 lb (116.6 kg), SpO2 97 %. Body mass index is 40.25 kg/m.  General: Cooperative, alert, well developed, in no acute distress. HEENT: Conjunctivae and lids unremarkable. Cardiovascular: Regular rhythm.  Lungs: Normal work of breathing. Neurologic: No focal deficits.   Lab Results  Component Value Date   CREATININE 0.78 03/18/2020   BUN 14 03/18/2020   NA 142 03/18/2020   K 3.9 03/18/2020   CL 98 03/18/2020   CO2 26 03/18/2020   Lab Results  Component Value Date   ALT 13 03/18/2020   AST 20 03/18/2020   ALKPHOS 65 03/18/2020   BILITOT 1.0 03/18/2020   Lab Results  Component Value Date   HGBA1C 5.2 03/18/2020   HGBA1C 5.3 08/29/2019   Lab Results  Component Value Date   INSULIN 23.1 03/18/2020   INSULIN 24.9 08/29/2019   Lab Results  Component Value Date   TSH 1.720 08/29/2019   Lab Results  Component Value Date   CHOL 177 03/18/2020   HDL 53 03/18/2020   LDLCALC 108 (H) 03/18/2020   TRIG 84 03/18/2020   CHOLHDL 3.5 08/29/2019   Lab Results  Component Value Date   WBC 8.4 08/29/2019   HGB 13.8 08/29/2019   HCT 41.3 08/29/2019    MCV 88 08/29/2019   PLT 265 08/29/2019   No results found for: IRON, TIBC, FERRITIN  Attestation Statements:   Reviewed by clinician on day of visit: allergies, medications, problem list, medical history, surgical history, family history, social history, and previous encounter notes.   08/31/2019, am acting as Trude Mcburney for Energy manager, FNP-C.  I have reviewed the above documentation for accuracy and completeness, and I agree with the above. -  [place], FNP

## 2020-03-23 ENCOUNTER — Encounter (INDEPENDENT_AMBULATORY_CARE_PROVIDER_SITE_OTHER): Payer: Self-pay | Admitting: Family Medicine

## 2020-04-06 ENCOUNTER — Ambulatory Visit (INDEPENDENT_AMBULATORY_CARE_PROVIDER_SITE_OTHER): Payer: BC Managed Care – PPO | Admitting: Family Medicine

## 2020-04-13 ENCOUNTER — Ambulatory Visit (INDEPENDENT_AMBULATORY_CARE_PROVIDER_SITE_OTHER): Payer: BC Managed Care – PPO | Admitting: Adult Health

## 2020-04-13 ENCOUNTER — Encounter (INDEPENDENT_AMBULATORY_CARE_PROVIDER_SITE_OTHER): Payer: Self-pay | Admitting: Adult Health

## 2020-04-13 ENCOUNTER — Other Ambulatory Visit: Payer: Self-pay

## 2020-04-13 VITALS — BP 118/75 | HR 85 | Temp 98.1°F | Ht 67.0 in | Wt 254.0 lb

## 2020-04-13 DIAGNOSIS — Z9189 Other specified personal risk factors, not elsewhere classified: Secondary | ICD-10-CM | POA: Diagnosis not present

## 2020-04-13 DIAGNOSIS — E8881 Metabolic syndrome: Secondary | ICD-10-CM | POA: Diagnosis not present

## 2020-04-13 DIAGNOSIS — E559 Vitamin D deficiency, unspecified: Secondary | ICD-10-CM

## 2020-04-13 DIAGNOSIS — E782 Mixed hyperlipidemia: Secondary | ICD-10-CM

## 2020-04-13 DIAGNOSIS — Z6839 Body mass index (BMI) 39.0-39.9, adult: Secondary | ICD-10-CM

## 2020-04-13 MED ORDER — SAXENDA 18 MG/3ML ~~LOC~~ SOPN: 3.0000 mg | PEN_INJECTOR | Freq: Every day | SUBCUTANEOUS | 0 refills | Status: DC

## 2020-04-13 MED ORDER — VITAMIN D (ERGOCALCIFEROL) 1.25 MG (50000 UNIT) PO CAPS: 50000.0000 [IU] | ORAL_CAPSULE | ORAL | 0 refills | Status: DC

## 2020-04-14 NOTE — Progress Notes (Signed)
Chief Complaint:   OBESITY Carla Watson is here to discuss her progress with her obesity treatment plan along with follow-up of her obesity related diagnoses. Carla Watson is on the Category 2 Plan and states she is following her eating plan approximately 70% of the time. Carla Watson states she is walking 25-30 minutes 4-5 times per week.  Today's visit was #: 13 Starting weight: 267 lbs Starting date: 08/29/2019 Today's weight: 254 lbs Today's date: 04/13/2020 Total lbs lost to date: 13 lbs Total lbs lost since last in-office visit: 3 lbs  Interim History: Carla Watson has been meal planning/prepping more on the weekend to help stay on track during the weekdays. She recently increased Saxenda to 2.4 mg due to polyphagia. She denies medication side effects- mass in neck, dysphagia, dyspepsia, or persistent hoarseness.  Subjective:   1. Vitamin D deficiency Discussed labs with patient today.Carla Watson's Vitamin D level was 53.2 on 03/18/2020. She is currently taking prescription vitamin D 50,000 IU each week. She denies nausea, vomiting or muscle weakness.   Ref. Range 03/18/2020 07:53  Vitamin D, 25-Hydroxy Latest Ref Range: 30.0 - 100.0 ng/mL 53.2   2. Mixed hyperlipidemia Discussed labs with patient today. Pt's 03/18/2020 Lipid panel resulted a total 177 and LDL 108, which have both improved from last check.  Lab Results  Component Value Date   ALT 13 03/18/2020   AST 20 03/18/2020   ALKPHOS 65 03/18/2020   BILITOT 1.0 03/18/2020   Lab Results  Component Value Date   CHOL 177 03/18/2020   HDL 53 03/18/2020   LDLCALC 108 (H) 03/18/2020   TRIG 84 03/18/2020   CHOLHDL 3.5 08/29/2019   3. Insulin resistance Discussed labs with patient today. Pt's 2/2/52022 insulin level 23.1. Her blood glucose and A1c are within normal limits. Saxenda was increased from 1.8 mg to 2.4 mg at las to OV on 03/18/20.  Lab Results  Component Value Date   INSULIN 23.1 03/18/2020   INSULIN 24.9 08/29/2019   Lab  Results  Component Value Date   HGBA1C 5.2 03/18/2020    4. At risk for diabetes mellitus Baley is at higher than average risk for developing diabetes due to obesity and insulin resistance.  Assessment/Plan:   1. Vitamin D deficiency Low Vitamin D level contributes to fatigue and are associated with obesity, breast, and colon cancer. She agrees to continue to take prescription Vitamin D @50 ,000 IU every week and will follow-up for routine testing of Vitamin D, at least 2-3 times per year to avoid over-replacement. I recommend refilling 2 more times, then converting to OTC Vit D.  - Vitamin D, Ergocalciferol, (DRISDOL) 1.25 MG (50000 UNIT) CAPS capsule; Take 1 capsule (50,000 Units total) by mouth every 7 (seven) days.  Dispense: 4 capsule; Refill: 0  2. Mixed hyperlipidemia Cardiovascular risk and specific lipid/LDL goals reviewed.  We discussed several lifestyle modifications today and Carla Watson will continue to work on diet, exercise and weight loss efforts. Orders and follow up as documented in patient record.   Counseling Intensive lifestyle modifications are the first line treatment for this issue. . Dietary changes: Increase soluble fiber. Decrease simple carbohydrates. . Exercise changes: Moderate to vigorous-intensity aerobic activity 150 minutes per week if tolerated. . Lipid-lowering medications: see documented in medical record.  3. Insulin resistance Carla Watson will continue to work on weight loss, exercise, and decreasing simple carbohydrates to help decrease the risk of diabetes. Carla Watson agreed to follow-up with [name] as directed to closely monitor her progress. Saxenda 3 mg  daily was sent in for better insurance coverage.  4. At risk for diabetes mellitus Carla Watson was given approximately 15 minutes of diabetes education and counseling today. We discussed intensive lifestyle modifications today with an emphasis on weight loss as well as increasing exercise and decreasing simple  carbohydrates in her diet. We also reviewed medication options with an emphasis on risk versus benefit of those discussed.   Repetitive spaced learning was employed today to elicit superior memory formation and behavioral change.  5. Class 2 severe obesity with serious comorbidity and body mass index (BMI) of 39.0 to 39.9 in adult, unspecified obesity type [facility]) [name] is currently in the action stage of change. As such, her goal is to continue with weight loss efforts. She has agreed to the Category 2 Plan.   We discussed various medication options to help Carla Watson with her weight loss efforts and we both agreed to continue [place], as per below. - Liraglutide -Weight Management (SAXENDA) 18 MG/3ML SOPN; Inject 3 mg into the skin daily. Pt to inject 2.4 mg once daily  Dispense: 15 mL; Refill: 0  Exercise goals: As is  Behavioral modification strategies: increasing lean protein intake, decreasing simple carbohydrates, increasing high fiber foods, no skipping meals, meal planning and cooking strategies and planning for success.  Carla Watson has agreed to follow-up with our clinic in 2-3 weeks. She was informed of the importance of frequent follow-up visits to maximize her success with intensive lifestyle modifications for her multiple health conditions.   Objective:   Blood pressure 118/75, pulse 85, temperature 98.1 F (36.7 C), height 5\' 7"  (1.702 m), weight 254 lb (115.2 kg), SpO2 97 %. Body mass index is 39.78 kg/m.  General: Cooperative, alert, well developed, in no acute distress. HEENT: Conjunctivae and lids unremarkable. Cardiovascular: Regular rhythm.  Lungs: Normal work of breathing. Neurologic: No focal deficits.   Lab Results  Component Value Date   CREATININE 0.78 03/18/2020   BUN 14 03/18/2020   NA 142 03/18/2020   K 3.9 03/18/2020   CL 98 03/18/2020   CO2 26 03/18/2020   Lab Results  Component Value Date   ALT 13 03/18/2020   AST 20 03/18/2020   ALKPHOS 65  03/18/2020   BILITOT 1.0 03/18/2020   Lab Results  Component Value Date   HGBA1C 5.2 03/18/2020   HGBA1C 5.3 08/29/2019   Lab Results  Component Value Date   INSULIN 23.1 03/18/2020   INSULIN 24.9 08/29/2019   Lab Results  Component Value Date   TSH 1.720 08/29/2019   Lab Results  Component Value Date   CHOL 177 03/18/2020   HDL 53 03/18/2020   LDLCALC 108 (H) 03/18/2020   TRIG 84 03/18/2020   CHOLHDL 3.5 08/29/2019   Lab Results  Component Value Date   WBC 8.4 08/29/2019   HGB 13.8 08/29/2019   HCT 41.3 08/29/2019   MCV 88 08/29/2019   PLT 265 08/29/2019    Attestation Statements:   Reviewed by clinician on day of visit: allergies, medications, problem list, medical history, surgical history, family history, social history, and previous encounter notes.  08/31/2019, am acting as Edmund Hilda for Energy manager, NP.  I have reviewed the above documentation for accuracy and completeness, and I agree with the above. -  Laketa Sandoz d. Helana Macbride, NP-C

## 2020-04-27 ENCOUNTER — Ambulatory Visit (INDEPENDENT_AMBULATORY_CARE_PROVIDER_SITE_OTHER): Payer: BC Managed Care – PPO | Admitting: Adult Health

## 2020-04-27 ENCOUNTER — Encounter (INDEPENDENT_AMBULATORY_CARE_PROVIDER_SITE_OTHER): Payer: Self-pay | Admitting: Adult Health

## 2020-04-27 ENCOUNTER — Other Ambulatory Visit: Payer: Self-pay

## 2020-04-27 VITALS — BP 102/67 | HR 78 | Temp 97.9°F | Ht 67.0 in | Wt 253.0 lb

## 2020-04-27 DIAGNOSIS — I10 Essential (primary) hypertension: Secondary | ICD-10-CM

## 2020-04-27 DIAGNOSIS — E8881 Metabolic syndrome: Secondary | ICD-10-CM | POA: Diagnosis not present

## 2020-04-27 DIAGNOSIS — Z6839 Body mass index (BMI) 39.0-39.9, adult: Secondary | ICD-10-CM

## 2020-04-27 DIAGNOSIS — Z9189 Other specified personal risk factors, not elsewhere classified: Secondary | ICD-10-CM | POA: Diagnosis not present

## 2020-04-27 MED ORDER — LISINOPRIL-HYDROCHLOROTHIAZIDE 10-12.5 MG PO TABS: 1.0000 | ORAL_TABLET | Freq: Every day | ORAL | 0 refills | Status: DC

## 2020-04-28 NOTE — Progress Notes (Signed)
Chief Complaint:   OBESITY Carla Watson is here to discuss her progress with her obesity treatment plan along with follow-up of her obesity related diagnoses. Carla Watson is on the Category 2 Plan and states she is following her eating plan approximately 70% of the time. Carla Watson states she is doing cardio and strength 45 minutes 4-5 times per week.  Today's visit was #: 14 Starting weight: 267 lbs Starting date: 1/75/2021 Today's weight: 253 lbs Today's date: 04/27/2020 Total lbs lost to date: 14 lbs Total lbs lost since last in-office visit: 1 lb  Interim History: Carla Watson has enjoyed [employer] and she has recently started meal plan/prepping. She is on Saxenda 2.4 mg QD. She experienced 1 day of nausea without vomiting when she had a carb/sweets heavy day.  Subjective:   1. Insulin resistance Carla Watson's insulin level is slightly improved at 23.1 on 03/18/2020. She is on Saxenda for obesity.  Lab Results  Component Value Date   INSULIN 23.1 03/18/2020   INSULIN 24.9 08/29/2019   Lab Results  Component Value Date   HGBA1C 5.2 03/18/2020    2. Essential hypertension Carla Watson's BP and HR are excellent at OV. She is on separate lisinopril 10 QD and HCTZ 12.5 mg. Her prescription was during pharmacy shortage that occurred in 2021.  BP Readings from Last 3 Encounters:  04/27/20 102/67  04/13/20 118/75  03/18/20 115/70    3. At risk for nausea Carla Watson is at risk for nausea due to GLP-1 and obesity.  Assessment/Plan:   1. Insulin resistance Carla Watson will continue to work on weight loss, exercise, and decreasing simple carbohydrates to help decrease the risk of diabetes. Carla Watson agreed to follow-up with [name] as directed to closely monitor her progress. Continue category 2 and regular exercise.  2. Essential hypertension Carla Watson is working on healthy weight loss and exercise to improve blood pressure control. We will watch for signs of hypotension as she continues her lifestyle  modifications. [place] refill of combo lisinopril/HCTZ, as per below.  - lisinopril-hydrochlorothiazide (ZESTORETIC) 10-12.5 MG tablet; Take 1 tablet by mouth daily.  Dispense: 90 tablet; Refill: 0  3. At risk for nausea Carla Watson was given approximately 15 minutes of nausea prevention counseling today. Carla Watson is at risk for nausea due to her new or current medication. She was encouraged to titrate her medication slowly, make sure to stay hydrated, eat smaller portions throughout the day, and avoid high fat meals.   4. Class 2 severe obesity with serious comorbidity and body mass index (BMI) of 39.0 to 39.9 in adult, unspecified obesity type Carla Watson) Carla Watson is currently in the action stage of change. As such, her goal is to continue with weight loss efforts. She has agreed to the Category 2 Plan.   Check waist circumference at next OV.  Exercise goals: As is  Behavioral modification strategies: increasing lean protein intake, meal planning and cooking strategies and planning for success.  Carla Watson has agreed to follow-up with our clinic in 2-3 weeks. She was informed of the importance of frequent follow-up visits to maximize her success with intensive lifestyle modifications for her multiple health conditions.   Objective:   Blood pressure 102/67, pulse 78, temperature 97.9 F (36.6 C), height 5\' 7"  (1.702 m), weight 253 lb (114.8 kg), SpO2 (!) 78 %. Body mass index is 39.63 kg/m.  General: Cooperative, alert, well developed, in no acute distress. HEENT: Conjunctivae and lids unremarkable. Cardiovascular: Regular rhythm.  Lungs: Normal work of breathing. Neurologic: No focal  deficits.   Lab Results  Component Value Date   CREATININE 0.78 03/18/2020   BUN 14 03/18/2020   NA 142 03/18/2020   K 3.9 03/18/2020   CL 98 03/18/2020   CO2 26 03/18/2020   Lab Results  Component Value Date   ALT 13 03/18/2020   AST 20 03/18/2020   ALKPHOS 65 03/18/2020   BILITOT 1.0 03/18/2020    Lab Results  Component Value Date   HGBA1C 5.2 03/18/2020   HGBA1C 5.3 08/29/2019   Lab Results  Component Value Date   INSULIN 23.1 03/18/2020   INSULIN 24.9 08/29/2019   Lab Results  Component Value Date   TSH 1.720 08/29/2019   Lab Results  Component Value Date   CHOL 177 03/18/2020   HDL 53 03/18/2020   LDLCALC 108 (H) 03/18/2020   TRIG 84 03/18/2020   CHOLHDL 3.5 08/29/2019   Lab Results  Component Value Date   WBC 8.4 08/29/2019   HGB 13.8 08/29/2019   HCT 41.3 08/29/2019   MCV 88 08/29/2019   PLT 265 08/29/2019   No results found for: IRON, TIBC, FERRITIN   Attestation Statements:   Reviewed by clinician on day of visit: allergies, medications, problem list, medical history, surgical history, family history, social history, and previous encounter notes.  Edmund Hilda, am acting as Energy manager for William Hamburger, NP.  I have reviewed the above documentation for accuracy and completeness, and I agree with the above. -  Jashanti Clinkscale d. Tylon Kemmerling, NP-C

## 2020-04-30 ENCOUNTER — Encounter (INDEPENDENT_AMBULATORY_CARE_PROVIDER_SITE_OTHER): Payer: Self-pay

## 2020-05-13 ENCOUNTER — Other Ambulatory Visit: Payer: Self-pay

## 2020-05-13 ENCOUNTER — Ambulatory Visit (INDEPENDENT_AMBULATORY_CARE_PROVIDER_SITE_OTHER): Payer: BC Managed Care – PPO | Admitting: Adult Health

## 2020-05-13 ENCOUNTER — Encounter (INDEPENDENT_AMBULATORY_CARE_PROVIDER_SITE_OTHER): Payer: Self-pay | Admitting: Adult Health

## 2020-05-13 ENCOUNTER — Other Ambulatory Visit (INDEPENDENT_AMBULATORY_CARE_PROVIDER_SITE_OTHER): Payer: Self-pay | Admitting: Adult Health

## 2020-05-13 VITALS — BP 126/76 | HR 85 | Temp 97.9°F | Ht 67.0 in | Wt 254.0 lb

## 2020-05-13 DIAGNOSIS — E559 Vitamin D deficiency, unspecified: Secondary | ICD-10-CM

## 2020-05-13 DIAGNOSIS — Z6839 Body mass index (BMI) 39.0-39.9, adult: Secondary | ICD-10-CM | POA: Diagnosis not present

## 2020-05-13 DIAGNOSIS — E8881 Metabolic syndrome: Secondary | ICD-10-CM

## 2020-05-13 DIAGNOSIS — Z9189 Other specified personal risk factors, not elsewhere classified: Secondary | ICD-10-CM

## 2020-05-13 MED ORDER — VITAMIN D (ERGOCALCIFEROL) 1.25 MG (50000 UNIT) PO CAPS: 50000.0000 [IU] | ORAL_CAPSULE | ORAL | 0 refills | Status: DC

## 2020-05-13 MED ORDER — SAXENDA 18 MG/3ML ~~LOC~~ SOPN: PEN_INJECTOR | SUBCUTANEOUS | 0 refills | Status: DC

## 2020-05-13 NOTE — Telephone Encounter (Signed)
Requesting 12

## 2020-05-18 NOTE — Progress Notes (Signed)
Chief Complaint:   OBESITY Carla Watson is here to discuss her progress with her obesity treatment plan along with follow-up of her obesity related diagnoses. Carla Watson is on the Category 2 Plan and states she is following her eating plan approximately 60% of the time. Carla Watson states she is walking and doing boot camp 20-30 minutes 4-5 times per week.  Today's visit was #: 15 Starting weight: 267 lbs Starting date: 08/29/2019 Today's weight: 254 lbs Today's date: 05/13/2020 Total lbs lost to date: 13 lbs Total lbs lost since last in-office visit: 0  Interim History: Carla Watson ate off plan more than 40% of the time over the last several weeks. She is up 1 lb of water weight today, per bioimpedance. She is on Saxenda 2.4 mg QD and tolerating it well. She has been on this dose for over a month.  Subjective:   1. Vitamin D deficiency Carla Watson's Vitamin D level was much improved at 53.2 on 03/18/2020. She is currently taking prescription vitamin D 50,000 IU each week. She denies nausea, vomiting or muscle weakness.   Ref. Range 03/18/2020 07:53  Vitamin D, 25-Hydroxy Latest Ref Range: 30.0 - 100.0 ng/mL 53.2   2. Insulin resistance Carla Watson's 03/18/2020 labs revealed an elevated insulin level at 23.1 with normal BG of 83 and A1c 5.2.  Lab Results  Component Value Date   INSULIN 23.1 03/18/2020   INSULIN 24.9 08/29/2019   Lab Results  Component Value Date   HGBA1C 5.2 03/18/2020    3. At risk for constipation Carla Watson is at risk for constipation due to obesity and GLP-1 for obesity.  Assessment/Plan:   1. Vitamin D deficiency Low Vitamin D level contributes to fatigue and are associated with obesity, breast, and colon cancer. She agrees to continue to take prescription Vitamin D @50 ,000 IU every week and will follow-up for routine testing of Vitamin D, at least 2-3 times per year to avoid over-replacement.  2. Insulin resistance Carla Watson will continue to work on weight loss, exercise, and  decreasing simple carbohydrates to help decrease the risk of diabetes. Carla Watson agreed to follow-up with Carla Watson as directed to closely monitor her progress. Increase protein and regular exercise.  3. At risk for constipation Carla Watson was given approximately 15 minutes of counseling today regarding prevention of constipation. She was encouraged to increase water and fiber intake.   4. Class 2 severe obesity with serious comorbidity and body mass index (BMI) of 39.0 to 39.9 in adult, unspecified obesity type Carla Watson) Carla Watson is currently in the action stage of change. As such, her goal is to continue with weight loss efforts. She has agreed to the Category 2 Plan.   We discussed various medication options to help Carla Watson with her weight loss efforts and we both agreed to increasing Saxenda to 3 mg, as per below. - Liraglutide -Weight Management (SAXENDA) 18 MG/3ML SOPN; Inject 3mg  once daily  Dispense: 15 mL; Refill: 0  Exercise goals: As is  Behavioral modification strategies: increasing lean protein intake, decreasing simple carbohydrates, meal planning and cooking strategies, better snacking choices and planning for success.  Carla Watson has agreed to follow-up with our clinic in 2-3 weeks. She was informed of the importance of frequent follow-up visits to maximize her success with intensive lifestyle modifications for her multiple health conditions.   Objective:   Blood pressure 126/76, pulse 85, temperature 97.9 F (36.6 C), height 5\' 7"  (1.702 m), weight 254 lb (115.2 kg), SpO2 95 %. Body mass index is 39.78 kg/m.  General: Cooperative, alert, well developed, in no acute distress. HEENT: Conjunctivae and lids unremarkable. Cardiovascular: Regular rhythm.  Lungs: Normal work of breathing. Neurologic: No focal deficits.   Lab Results  Component Value Date   CREATININE 0.78 03/18/2020   BUN 14 03/18/2020   NA 142 03/18/2020   K 3.9 03/18/2020   CL 98 03/18/2020   CO2 26 03/18/2020   Lab  Results  Component Value Date   ALT 13 03/18/2020   AST 20 03/18/2020   ALKPHOS 65 03/18/2020   BILITOT 1.0 03/18/2020   Lab Results  Component Value Date   HGBA1C 5.2 03/18/2020   HGBA1C 5.3 08/29/2019   Lab Results  Component Value Date   INSULIN 23.1 03/18/2020   INSULIN 24.9 08/29/2019   Lab Results  Component Value Date   TSH 1.720 08/29/2019   Lab Results  Component Value Date   CHOL 177 03/18/2020   HDL 53 03/18/2020   LDLCALC 108 (H) 03/18/2020   TRIG 84 03/18/2020   CHOLHDL 3.5 08/29/2019   Lab Results  Component Value Date   WBC 8.4 08/29/2019   HGB 13.8 08/29/2019   HCT 41.3 08/29/2019   MCV 88 08/29/2019   PLT 265 08/29/2019    Attestation Statements:   Reviewed by clinician on day of visit: allergies, medications, problem list, medical history, surgical history, family history, social history, and previous encounter notes.   Edmund Hilda, am acting as Energy manager for William Hamburger, NP.  I have reviewed the above documentation for accuracy and completeness, and I agree with the above. -  Ailani Governale d. Dontreal Miera, NP-C

## 2020-06-08 ENCOUNTER — Encounter (INDEPENDENT_AMBULATORY_CARE_PROVIDER_SITE_OTHER): Payer: Self-pay | Admitting: Adult Health

## 2020-06-08 ENCOUNTER — Ambulatory Visit (INDEPENDENT_AMBULATORY_CARE_PROVIDER_SITE_OTHER): Payer: BC Managed Care – PPO | Admitting: Adult Health

## 2020-06-08 ENCOUNTER — Other Ambulatory Visit: Payer: Self-pay

## 2020-06-08 VITALS — BP 121/62 | HR 63 | Temp 97.9°F | Ht 67.0 in | Wt 253.0 lb

## 2020-06-08 DIAGNOSIS — E8881 Metabolic syndrome: Secondary | ICD-10-CM | POA: Diagnosis not present

## 2020-06-08 DIAGNOSIS — Z9189 Other specified personal risk factors, not elsewhere classified: Secondary | ICD-10-CM

## 2020-06-08 DIAGNOSIS — E559 Vitamin D deficiency, unspecified: Secondary | ICD-10-CM

## 2020-06-08 DIAGNOSIS — Z6839 Body mass index (BMI) 39.0-39.9, adult: Secondary | ICD-10-CM | POA: Diagnosis not present

## 2020-06-08 MED ORDER — SAXENDA 18 MG/3ML ~~LOC~~ SOPN: PEN_INJECTOR | SUBCUTANEOUS | 0 refills | Status: DC

## 2020-06-09 NOTE — Progress Notes (Signed)
Chief Complaint:   OBESITY Carla Watson is here to discuss her progress with her obesity treatment plan along with follow-up of her obesity related diagnoses. Carla Watson is on the Category 2 Plan and states she is following her eating plan approximately 70% of the time. Carla Watson states she is walking ans strength training 30-45 minutes 5 times per week.  Today's visit was #: 16 Starting weight: 267 lbs Starting date: 08/29/2019 Today's weight: 253 lbs Today's date: 06/08/2020 Total lbs lost to date: 14 lbs Total lbs lost since last in-office visit: 1  Interim History: Carla Watson recently increased Saxenda to 3 mg. Pt denies mass in neck, dysphagia, dyspepsia, or persistent hoarseness. She reports excellent control of hunger levels with increase GLP-1.  Subjective:   1. Insulin resistance Carla Watson recently increased Saxenda from 2.4 mg to 3 mg QD. She reports excellent appetite control.  2. Vitamin D deficiency Carla Watson's Vitamin D level was 53.2 on 03/18/2020. She is currently taking prescription vitamin D 50,000 IU each week. She denies nausea, vomiting or muscle weakness.  3. At risk for constipation Carla Watson is at increased risk for constipation due to inadequate water intake, changes in diet, and/or use of medications such as GLP1 agonists. Carla Watson denies hard, infrequent stools currently.   Assessment/Plan:   1. Insulin resistance Carla Watson will continue to work on weight loss, exercise, and decreasing simple carbohydrates to help decrease the risk of diabetes. Carla Watson agreed to follow-up with Carla Watson as directed to closely monitor her progress. Check labs at next OV.  2. Vitamin D deficiency Low Vitamin D level contributes to fatigue and are associated with obesity, breast, and colon cancer. She agrees to continue to take prescription Vitamin D @50 ,000 IU every week and will follow-up for routine testing of Vitamin D, at least 2-3 times per year to avoid over-replacement. Check labs at next  OV.  3. At risk for constipation Carla Watson was given approximately 15 minutes of counseling today regarding prevention of constipation. She was encouraged to increase water and fiber intake.   4. Class 2 severe obesity with serious comorbidity and body mass index (BMI) of 39.0 to 39.9 in adult, unspecified obesity type [facility]) Carla Watson is currently in the action stage of change. As such, her goal is to continue with weight loss efforts. She has agreed to the Category 2 Plan.   Check fasting labs at next OV.  We discussed various medication options to help Carla Watson with her weight loss efforts and we both agreed to continue Saxenda 3 mg, as prescribed below. - Liraglutide -Weight Management (SAXENDA) 18 MG/3ML SOPN; Inject 3mg  once daily  Dispense: 15 mL; Refill: 0  Exercise goals: As is  Behavioral modification strategies: increasing lean protein intake, decreasing simple carbohydrates, increasing water intake, meal planning and cooking strategies, better snacking choices and keeping a strict food journal.  Carla Watson has agreed to follow-up with our clinic in 2-3 weeks. She was informed of the importance of frequent follow-up visits to maximize her success with intensive lifestyle modifications for her multiple health conditions.   Objective:   Blood pressure 121/62, pulse 63, temperature 97.9 F (36.6 C), height 5\' 7"  (1.702 m), weight 253 lb (114.8 kg), SpO2 96 %. Body mass index is 39.63 kg/m.  General: Cooperative, alert, well developed, in no acute distress. HEENT: Conjunctivae and lids unremarkable. Cardiovascular: Regular rhythm.  Lungs: Normal work of breathing. Neurologic: No focal deficits.   Lab Results  Component Value Date   CREATININE 0.78 03/18/2020   BUN 14  03/18/2020   NA 142 03/18/2020   K 3.9 03/18/2020   CL 98 03/18/2020   CO2 26 03/18/2020   Lab Results  Component Value Date   ALT 13 03/18/2020   AST 20 03/18/2020   ALKPHOS 65 03/18/2020   BILITOT 1.0  03/18/2020   Lab Results  Component Value Date   HGBA1C 5.2 03/18/2020   HGBA1C 5.3 08/29/2019   Lab Results  Component Value Date   INSULIN 23.1 03/18/2020   INSULIN 24.9 08/29/2019   Lab Results  Component Value Date   TSH 1.720 08/29/2019   Lab Results  Component Value Date   CHOL 177 03/18/2020   HDL 53 03/18/2020   LDLCALC 108 (H) 03/18/2020   TRIG 84 03/18/2020   CHOLHDL 3.5 08/29/2019   Lab Results  Component Value Date   WBC 8.4 08/29/2019   HGB 13.8 08/29/2019   HCT 41.3 08/29/2019   MCV 88 08/29/2019   PLT 265 08/29/2019    Attestation Statements:   Reviewed by clinician on day of visit: allergies, medications, problem list, medical history, surgical history, family history, social history, and previous encounter notes.  Edmund Hilda, am acting as Energy manager for William Hamburger, NP.  I have reviewed the above documentation for accuracy and completeness, and I agree with the above. -  Justo Hengel d. Tyriq Moragne, NP-C

## 2020-06-21 ENCOUNTER — Other Ambulatory Visit (INDEPENDENT_AMBULATORY_CARE_PROVIDER_SITE_OTHER): Payer: Self-pay | Admitting: Adult Health

## 2020-06-22 NOTE — Telephone Encounter (Signed)
Pt last seen by Katy Danford, FNP.  

## 2020-06-29 ENCOUNTER — Other Ambulatory Visit (INDEPENDENT_AMBULATORY_CARE_PROVIDER_SITE_OTHER): Payer: Self-pay | Admitting: Adult Health

## 2020-06-29 ENCOUNTER — Encounter (INDEPENDENT_AMBULATORY_CARE_PROVIDER_SITE_OTHER): Payer: Self-pay | Admitting: Adult Health

## 2020-06-29 ENCOUNTER — Ambulatory Visit (INDEPENDENT_AMBULATORY_CARE_PROVIDER_SITE_OTHER): Payer: BC Managed Care – PPO | Admitting: Adult Health

## 2020-06-29 ENCOUNTER — Other Ambulatory Visit: Payer: Self-pay

## 2020-06-29 VITALS — BP 109/71 | HR 71 | Temp 98.7°F | Ht 67.0 in | Wt 253.0 lb

## 2020-06-29 DIAGNOSIS — E8881 Metabolic syndrome: Secondary | ICD-10-CM | POA: Diagnosis not present

## 2020-06-29 DIAGNOSIS — Z9189 Other specified personal risk factors, not elsewhere classified: Secondary | ICD-10-CM | POA: Diagnosis not present

## 2020-06-29 DIAGNOSIS — E559 Vitamin D deficiency, unspecified: Secondary | ICD-10-CM

## 2020-06-29 DIAGNOSIS — I10 Essential (primary) hypertension: Secondary | ICD-10-CM | POA: Diagnosis not present

## 2020-06-29 DIAGNOSIS — E782 Mixed hyperlipidemia: Secondary | ICD-10-CM

## 2020-06-29 DIAGNOSIS — Z6839 Body mass index (BMI) 39.0-39.9, adult: Secondary | ICD-10-CM

## 2020-06-29 DIAGNOSIS — E88819 Insulin resistance, unspecified: Secondary | ICD-10-CM

## 2020-06-29 DIAGNOSIS — E66812 Obesity, class 2: Secondary | ICD-10-CM

## 2020-06-29 MED ORDER — BD PEN NEEDLE NANO 2ND GEN 32G X 4 MM MISC: 0 refills | Status: DC

## 2020-06-29 MED ORDER — SAXENDA 18 MG/3ML ~~LOC~~ SOPN: PEN_INJECTOR | SUBCUTANEOUS | 0 refills | Status: DC

## 2020-06-29 NOTE — Progress Notes (Signed)
Chief Complaint:   OBESITY Carla Watson is here to discuss her progress with her obesity treatment plan along with follow-up of her obesity related diagnoses. Carla Watson is on the Category 2 Plan and states she is following her eating plan approximately 60% of the time. Carla Watson states she is walking 30 minutes 7 times per week.  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 She will utilize PC/[place] when eating off plan. She is on Saxenda 3 mg-tolerating well and reports excellent appetite control on injectable GLP-1. She denies mass in neck, dysphagia, dyspepsia, persistent hoarseness, constipation, or nausea.  Subjective:   1. Essential hypertension BP/HR at goal. Carla Watson is on lisinopril/HCTZ 10-12.5 mg QD.  BP Readings from Last 3 Encounters:  06/29/20 109/71  06/08/20 121/62  05/13/20 126/76   2. Insulin resistance Insulin level has been greater than 20 at last 2 checks.  Carla Watson is on Saxenda for obesity.  Lab Results  Component Value Date   INSULIN 23.1 03/18/2020   INSULIN 24.9 08/29/2019   Lab Results  Component Value Date   HGBA1C 5.2 03/18/2020   3. Vitamin D deficiency Carla Watson's Vitamin D level was 53.2 on 03/18/2020. She is currently taking prescription vitamin D 50,000 IU each week. She denies nausea, vomiting or muscle weakness.  4. Mixed hyperlipidemia Carla Watson is not on statin therapy.  03/18/2020 lipid panel- LDL slightly above goal at 108.  Lab Results  Component Value Date   ALT 13 03/18/2020   AST 20 03/18/2020   ALKPHOS 65 03/18/2020   BILITOT 1.0 03/18/2020   Lab Results  Component Value Date   CHOL 177 03/18/2020   HDL 53 03/18/2020   LDLCALC 108 (H) 03/18/2020   TRIG 84 03/18/2020   CHOLHDL 3.5  08/29/2019    5. At risk for diabetes mellitus Carla Watson is at higher than average risk for developing diabetes due to obesity and insulin resistance.  Assessment/Plan:   1. Essential hypertension Navina is working on healthy weight loss and exercise to improve blood pressure control. We will watch for signs of hypotension as she continues her lifestyle modifications. - Comprehensive metabolic panel  2. Insulin resistance Carla Watson will continue to work on weight loss, exercise, and decreasing simple carbohydrates to help decrease the risk of diabetes. Carla Watson agreed to follow-up with [name] as directed to closely monitor her progress. - Hemoglobin A1c - Insulin, random  3. Vitamin D deficiency Low Vitamin D level contributes to fatigue and are associated with obesity, breast, and colon cancer. She agrees to continue to take prescription Vitamin D @50 ,000 IU every week and will follow-up for routine testing of Vitamin D, at least 2-3 times per year to avoid over-replacement. - VITAMIN D 25 Hydroxy (Vit-D Deficiency, Fractures)  4. Mixed hyperlipidemia Cardiovascular risk and specific lipid/LDL goals reviewed.  We discussed several lifestyle modifications today and Carla Watson will continue to work on diet, exercise and weight loss efforts. Orders and follow up as documented in patient record.   Counseling Intensive lifestyle modifications are the first line treatment for this issue. . Dietary changes: Increase soluble fiber. Decrease simple carbohydrates. . Exercise changes: Moderate to vigorous-intensity aerobic activity 150 minutes per week if tolerated. . Lipid-lowering medications: see documented in medical record. - Lipid panel  5. At  risk for diabetes mellitus Carla Watson was given approximately 15 minutes of diabetes education and counseling today. We discussed intensive lifestyle modifications today with an emphasis on weight loss as well as increasing exercise and decreasing simple  carbohydrates in her diet. We also reviewed medication options with an emphasis on risk versus benefit of those discussed.   Repetitive spaced learning was employed today to elicit superior memory formation and behavioral change.  6. Class 2 severe obesity with serious comorbidity and body mass index (BMI) of 39.0 to 39.9 in adult, unspecified obesity type [facility])  Carla Watson is currently in the action stage of change. As such, her goal is to continue with weight loss efforts. She has agreed to the Category 2 Plan.   We discussed various medication options to help Carla Watson with her weight loss efforts and we both agreed to continue Saxenda, as prescribed below. - Liraglutide -Weight Management (SAXENDA) 18 MG/3ML SOPN; Inject 3mg  once daily  Dispense: 15 mL; Refill: 0 - Insulin Pen Needle (BD PEN NEEDLE NANO 2ND GEN) 32G X 4 MM MISC; Use 1 needle daily to inject Saxenda.  Dispense: 100 each; Refill: 0  Exercise goals: As is  Behavioral modification strategies: increasing lean protein intake, decreasing simple carbohydrates, better snacking choices and planning for success.  Carla Watson has agreed to follow-up with our clinic in 3-4 weeks. She was informed of the importance of frequent follow-up visits to maximize her success with intensive lifestyle modifications for her multiple health conditions.   Carla Watson was informed we would discuss her lab results at her next visit unless there is a critical issue that needs to be addressed sooner. Carla Watson agreed to keep her next visit at the agreed upon time to discuss these results.  Objective:   Blood pressure 109/71, pulse 71, temperature 98.7 F (37.1 C), height 5\' 7"  (1.702 m), weight 253 lb (114.8 kg), SpO2 97 %. Body mass index is 39.63 kg/m.  General: Cooperative, alert, well developed, in no acute distress. HEENT: Conjunctivae and lids unremarkable. Cardiovascular: Regular rhythm.  Lungs: Normal work of breathing. Neurologic: No focal deficits.    Lab Results  Component Value Date   CREATININE 0.78 03/18/2020   BUN 14 03/18/2020   NA 142 03/18/2020   K 3.9 03/18/2020   CL 98 03/18/2020   CO2 26 03/18/2020   Lab Results  Component Value Date   ALT 13 03/18/2020   AST 20 03/18/2020   ALKPHOS 65 03/18/2020   BILITOT 1.0 03/18/2020   Lab Results  Component Value Date   HGBA1C 5.2 03/18/2020   HGBA1C 5.3 08/29/2019   Lab Results  Component Value Date   INSULIN 23.1 03/18/2020   INSULIN 24.9 08/29/2019   Lab Results  Component Value Date   TSH 1.720 08/29/2019   Lab Results  Component Value Date   CHOL 177 03/18/2020   HDL 53 03/18/2020   LDLCALC 108 (H) 03/18/2020   TRIG 84 03/18/2020   CHOLHDL 3.5 08/29/2019   Lab Results  Component Value Date   WBC 8.4 08/29/2019   HGB 13.8 08/29/2019   HCT 41.3 08/29/2019   MCV 88 08/29/2019   PLT 265 08/29/2019   No results found for: IRON, TIBC, FERRITIN   Attestation Statements:   Reviewed by clinician on day of visit: allergies, medications, problem list, medical history, surgical history, family history, social history, and previous encounter notes.  08/31/2019, CMA, am acting as transcriptionist for 08/31/2019, NP.  I have reviewed the above documentation for  accuracy and completeness, and I agree with the above. -  Carlota Philley d. Najae Filsaime, NP-C

## 2020-06-30 ENCOUNTER — Other Ambulatory Visit (INDEPENDENT_AMBULATORY_CARE_PROVIDER_SITE_OTHER): Payer: Self-pay | Admitting: Adult Health

## 2020-06-30 LAB — LIPID PANEL
Chol/HDL Ratio: 3.4 ratio (ref 0.0–4.4)
Cholesterol, Total: 192 mg/dL (ref 100–199)
HDL: 57 mg/dL (ref >39)
LDL Chol Calc (NIH): 118 mg/dL — ABNORMAL HIGH (ref 0–99)
Triglycerides: 93 mg/dL (ref 0–149)
VLDL Cholesterol Cal: 17 mg/dL (ref 5–40)

## 2020-06-30 LAB — COMPREHENSIVE METABOLIC PANEL
ALT: 15 IU/L (ref 0–32)
AST: 18 IU/L (ref 0–40)
Albumin/Globulin Ratio: 1.7 (ref 1.2–2.2)
Albumin: 4.6 g/dL (ref 3.8–4.8)
Alkaline Phosphatase: 66 IU/L (ref 44–121)
BUN/Creatinine Ratio: 17 (ref 9–23)
BUN: 13 mg/dL (ref 6–20)
Bilirubin Total: 1.4 mg/dL — ABNORMAL HIGH (ref 0.0–1.2)
CO2: 23 mmol/L (ref 20–29)
Calcium: 9.3 mg/dL (ref 8.7–10.2)
Chloride: 99 mmol/L (ref 96–106)
Creatinine, Ser: 0.76 mg/dL (ref 0.57–1.00)
Globulin, Total: 2.7 g/dL (ref 1.5–4.5)
Glucose: 86 mg/dL (ref 65–99)
Potassium: 4.2 mmol/L (ref 3.5–5.2)
Sodium: 137 mmol/L (ref 134–144)
Total Protein: 7.3 g/dL (ref 6.0–8.5)
eGFR: 102 mL/min/{1.73_m2} (ref >59)

## 2020-06-30 LAB — HEMOGLOBIN A1C
Est. average glucose Bld gHb Est-mCnc: 100 mg/dL
Hgb A1c MFr Bld: 5.1 % (ref 4.8–5.6)

## 2020-06-30 LAB — INSULIN, RANDOM: INSULIN: 14.5 u[IU]/mL (ref 2.6–24.9)

## 2020-06-30 LAB — VITAMIN D 25 HYDROXY (VIT D DEFICIENCY, FRACTURES): Vit D, 25-Hydroxy: 31.5 ng/mL (ref 30.0–100.0)

## 2020-06-30 NOTE — Telephone Encounter (Signed)
Pt last seen by Katy Danford, FNP.  

## 2020-07-27 ENCOUNTER — Other Ambulatory Visit: Payer: Self-pay

## 2020-07-27 ENCOUNTER — Ambulatory Visit (INDEPENDENT_AMBULATORY_CARE_PROVIDER_SITE_OTHER): Payer: BC Managed Care – PPO | Admitting: Adult Health

## 2020-07-27 VITALS — BP 110/69 | HR 73 | Temp 98.1°F | Ht 67.0 in | Wt 253.0 lb

## 2020-07-27 DIAGNOSIS — E559 Vitamin D deficiency, unspecified: Secondary | ICD-10-CM

## 2020-07-27 DIAGNOSIS — Z6839 Body mass index (BMI) 39.0-39.9, adult: Secondary | ICD-10-CM

## 2020-07-27 DIAGNOSIS — E782 Mixed hyperlipidemia: Secondary | ICD-10-CM | POA: Diagnosis not present

## 2020-07-27 DIAGNOSIS — I10 Essential (primary) hypertension: Secondary | ICD-10-CM

## 2020-07-27 DIAGNOSIS — Z9189 Other specified personal risk factors, not elsewhere classified: Secondary | ICD-10-CM

## 2020-07-27 DIAGNOSIS — E8881 Metabolic syndrome: Secondary | ICD-10-CM | POA: Diagnosis not present

## 2020-07-27 MED ORDER — VITAMIN D (ERGOCALCIFEROL) 1.25 MG (50000 UNIT) PO CAPS: 1.0000 | ORAL_CAPSULE | ORAL | 0 refills | Status: DC

## 2020-07-29 NOTE — Progress Notes (Signed)
Chief Complaint:   OBESITY Carla Watson is here to discuss her progress with her obesity treatment plan along with follow-up of her obesity related diagnoses. Carla Watson is on the Category 2 Plan and states she is following her eating plan approximately 50% of the time. Carla Watson states she is walking and strength training 20-30 minutes 5 times per week.  Today's visit was #: 18 Starting weight: 267 lbs Starting date: 08/29/2019 Today's weight: 253 lbs Today's date: 07/27/2020 Total lbs lost to date: 14 Total lbs lost since last in-office visit: 0  Interim History: Carla Watson was utilizing PC/[place] when out celebrating. She is happy to have maintained her weight.  She is on Saxenda 3 mg QD and denies side effects.  Subjective:   1. Essential hypertension Discussed labs with patient today. 06/29/2020 CMP- stable BP/HR at goal at [facility] is on lisinopril/HCTZ 10-12.5 mg QD.  BP Readings from Last 3 Encounters:  07/27/20 110/69  06/29/20 109/71  06/08/20 121/62   2. Mixed hyperlipidemia Worsening. Discussed labs with patient today. 06/29/2020 lipid panel- LDL increased to 118.  Carla Watson is not on statin therapy. Her father has HLD and is treated by statin.  Lab Results  Component Value Date   ALT 15 06/29/2020   AST 18 06/29/2020   ALKPHOS 66 06/29/2020   BILITOT 1.4 (H) 06/29/2020   Lab Results  Component Value Date   CHOL 192 06/29/2020   HDL 57 06/29/2020   LDLCALC 118 (H) 06/29/2020   TRIG 93 06/29/2020   CHOLHDL 3.4 06/29/2020   3. Vitamin D deficiency Discussed labs with patient today. Carla Watson's Vitamin D level was 31.5 on 06/29/2020- below goal of 50. She has been off ergocalciferol for several weeks.  4. Insulin resistance Discussed labs with patient today. 06/29/2020 BG 86, A1c 5.1, and insulin level 14.5. All levels are improving! Carla Watson is on Saxenda for obesity.  Lab Results  Component Value Date   INSULIN 14.5 06/29/2020   INSULIN 23.1 03/18/2020   INSULIN  24.9 08/29/2019   Lab Results  Component Value Date   HGBA1C 5.1 06/29/2020   5. At risk for osteoporosis Carla Watson is at higher risk of osteopenia and osteoporosis due to Vitamin D deficiency.    Assessment/Plan:   1. Essential hypertension Carla Watson is working on healthy weight loss and exercise to improve blood pressure control. We will watch for signs of hypotension as she continues her lifestyle modifications. -Continue category 2 meal plan and regular exercise.  2. Mixed hyperlipidemia Cardiovascular risk and specific lipid/LDL goals reviewed.  We discussed several lifestyle modifications today and Carla Watson will continue to work on diet, exercise and weight loss efforts. Orders and follow up as documented in patient record.  -Continue category 2 meal plan.  Counseling Intensive lifestyle modifications are the first line treatment for this issue. Dietary changes: Increase soluble fiber. Decrease simple carbohydrates. Exercise changes: Moderate to vigorous-intensity aerobic activity 150 minutes per week if tolerated. Lipid-lowering medications: see documented in medical record.  3. Vitamin D deficiency Low Vitamin D level contributes to fatigue and are associated with obesity, breast, and colon cancer. She agrees to restart to take prescription Vitamin D @50 ,000 IU every week and will follow-up for routine testing of Vitamin D, at least 2-3 times per year to avoid over-replacement.  - Vitamin D, Ergocalciferol, (DRISDOL) 1.25 MG (50000 UNIT) CAPS capsule; Take 1 capsule (50,000 Units total) by mouth every 7 (seven) days.  Dispense: 12 capsule; Refill: 0  4. Insulin resistance Carla Watson will  continue to work on weight loss, exercise, and decreasing simple carbohydrates to help decrease the risk of diabetes. Carla Watson agreed to follow-up with [name] as directed to closely monitor her progress. -Continue to increase protein and regular exercise.  5. At risk for osteoporosis Carla Watson was given  approximately 15 minutes of osteoporosis prevention counseling today. Carla Watson is at risk for osteopenia and osteoporosis due to her Vitamin D deficiency. She was encouraged to take her Vitamin D and follow her higher calcium diet and increase strengthening exercise to help strengthen her bones and decrease her risk of osteopenia and osteoporosis.  Repetitive spaced learning was employed today to elicit superior memory formation and behavioral change.  6. Class 2 severe obesity with serious comorbidity and body mass index (BMI) of 39.0 to 39.9 in adult, unspecified obesity type [facility])  Carla Watson is currently in the action stage of change. As such, her goal is to continue with weight loss efforts. She has agreed to the Category 2 Plan and practicing portion control and making smarter food choices, such as increasing vegetables and decreasing simple carbohydrates.   Start tracking with MyFitnessPal to help with accountability, especially with daily protein intake. Utilize 10:1 ratio when choosing snacks/meals.  Exercise goals:  As is  Behavioral modification strategies: increasing lean protein intake, decreasing simple carbohydrates, increasing water intake, meal planning and cooking strategies, keeping healthy foods in the home, and planning for success.  Carla Watson has agreed to follow-up with our clinic in 2 weeks. She was informed of the importance of frequent follow-up visits to maximize her success with intensive lifestyle modifications for her multiple health conditions.   Objective:   Blood pressure 110/69, pulse 73, temperature 98.1 F (36.7 C), height 5\' 7"  (1.702 m), weight 253 lb (114.8 kg), SpO2 97 %. Body mass index is 39.63 kg/m.  General: Cooperative, alert, well developed, in no acute distress. HEENT: Conjunctivae and lids unremarkable. Cardiovascular: Regular rhythm.  Lungs: Normal work of breathing. Neurologic: No focal deficits.   Lab Results  Component Value Date    CREATININE 0.76 06/29/2020   BUN 13 06/29/2020   NA 137 06/29/2020   K 4.2 06/29/2020   CL 99 06/29/2020   CO2 23 06/29/2020   Lab Results  Component Value Date   ALT 15 06/29/2020   AST 18 06/29/2020   ALKPHOS 66 06/29/2020   BILITOT 1.4 (H) 06/29/2020   Lab Results  Component Value Date   HGBA1C 5.1 06/29/2020   HGBA1C 5.2 03/18/2020   HGBA1C 5.3 08/29/2019   Lab Results  Component Value Date   INSULIN 14.5 06/29/2020   INSULIN 23.1 03/18/2020   INSULIN 24.9 08/29/2019   Lab Results  Component Value Date   TSH 1.720 08/29/2019   Lab Results  Component Value Date   CHOL 192 06/29/2020   HDL 57 06/29/2020   LDLCALC 118 (H) 06/29/2020   TRIG 93 06/29/2020   CHOLHDL 3.4 06/29/2020   Lab Results  Component Value Date   WBC 8.4 08/29/2019   HGB 13.8 08/29/2019   HCT 41.3 08/29/2019   MCV 88 08/29/2019   PLT 265 08/29/2019   No results found for: IRON, TIBC, FERRITIN  Attestation Statements:   Reviewed by clinician on day of visit: allergies, medications, problem list, medical history, surgical history, family history, social history, and previous encounter notes.  08/31/2019, CMA, am acting as transcriptionist for Edmund Hilda, NP.  I have reviewed the above documentation for accuracy and completeness, and I agree with the above. -  Abrar Bilton d. Jusitn Salsgiver, NP-C

## 2020-08-12 ENCOUNTER — Other Ambulatory Visit: Payer: Self-pay

## 2020-08-12 ENCOUNTER — Ambulatory Visit (INDEPENDENT_AMBULATORY_CARE_PROVIDER_SITE_OTHER): Payer: BC Managed Care – PPO | Admitting: Family Medicine

## 2020-08-12 VITALS — BP 104/66 | HR 92 | Temp 98.1°F | Ht 67.0 in | Wt 253.0 lb

## 2020-08-12 DIAGNOSIS — Z6839 Body mass index (BMI) 39.0-39.9, adult: Secondary | ICD-10-CM | POA: Diagnosis not present

## 2020-08-12 DIAGNOSIS — Z9189 Other specified personal risk factors, not elsewhere classified: Secondary | ICD-10-CM | POA: Diagnosis not present

## 2020-08-12 DIAGNOSIS — E559 Vitamin D deficiency, unspecified: Secondary | ICD-10-CM

## 2020-08-12 MED ORDER — SAXENDA 18 MG/3ML ~~LOC~~ SOPN: PEN_INJECTOR | SUBCUTANEOUS | 0 refills | Status: DC

## 2020-08-24 NOTE — Progress Notes (Signed)
Chief Complaint:   OBESITY Carla Watson is here to discuss her progress with her obesity treatment plan along with follow-up of her obesity related diagnoses.   Today's visit was #: 19 Starting weight: 267 lbs Starting date: 08/29/2019 Today's weight: 253 lbs Today's date: 08/12/2020 Weight change since last visit: 0 Total lbs lost to date: 14 lbs Body mass index is 39.63 kg/m.  Total weight loss percentage to date: -5.24%  Interim History:  Carla Watson is here for a follow up office visit.  We reviewed her meal plan and questions were answered.  Patient's food recall appears to be accurate and consistent with what is on plan when she is following it.   When eating on plan, her hunger and cravings are well controlled.    Carla Watson has been on Saxenda since January for hunger and cravings and has lost 4 pounds since.  Plan:  Refill Saxenda at same dose.  Current Meal Plan: the Category 2 Plan and practicing portion control and making smarter food choices, such as increasing vegetables and decreasing simple carbohydrates for 70% of the time.  Current Exercise Plan: Walking for 25-30 minutes 7 times per week and boot camp for 45 minutes 2-4 times per week. Current Anti-Obesity Medications: Saxenda. Side effects: None.  Assessment/Plan:   Medications Discontinued During This Encounter  Medication Reason   Liraglutide -Weight Management (SAXENDA) 18 MG/3ML SOPN Reorder   Meds ordered this encounter  Medications   Liraglutide -Weight Management (SAXENDA) 18 MG/3ML SOPN    Sig: Inject 3mg  once daily    Dispense:  15 mL    Refill:  0    1. Vitamin D deficiency Not at goal.  She is taking vitamin D 50,000 IU weekly.  Plan: Continue to take prescription Vitamin D @50 ,000 IU every week as prescribed.  Follow-up for routine testing of Vitamin D, at least 2-3 times per year to avoid over-replacement.  She denies the need for a refill today.  Lab Results  Component Value Date    VD25OH 31.5 06/29/2020   VD25OH 53.2 03/18/2020   VD25OH 25.7 (L) 08/29/2019   2. At risk for malnutrition Carla Watson was given extensive malnutrition prevention education and counseling today of more than 9 minutes.  Counseled her that malnutrition refers to inappropriate nutrients or not the right balance of nutrients for optimal health.  Discussed with Carla Watson that it is absolutely possible to be malnourished but yet obese.  Risk factors, including but not limited to, inappropriate dietary choices, difficulty with obtaining food due to physical or financial limitations, and various physical and mental health conditions were reviewed with Carla Watson.    3. Class 2 severe obesity with serious comorbidity and body mass index (BMI) of 39.0 to 39.9 in adult, unspecified obesity type (HCC)  - Liraglutide -Weight Management (SAXENDA) 18 MG/3ML SOPN; Inject 3mg  once daily  Dispense: 15 mL; Refill: 0  Course: Carla Watson is currently in the action stage of change. As such, her goal is to continue with weight loss efforts.   Nutrition goals: She has agreed to keeping a food journal and adhering to recommended goals of 1200-1300 calories and 90+ grams of protein.   Exercise goals:  As is.  Behavioral modification strategies: increasing lean protein intake, decreasing simple carbohydrates, meal planning and cooking strategies, better snacking choices, and planning for success.  Carla Watson has agreed to follow-up with our clinic in 2-3 weeks. She was informed of the importance of frequent follow-up visits  to maximize her success with intensive lifestyle modifications for her multiple health conditions.   Objective:   Blood pressure 104/66, pulse 92, temperature 98.1 F (36.7 C), height 5\' 7"  (1.702 m), weight 253 lb (114.8 kg), SpO2 97 %. Body mass index is 39.63 kg/m.  General: Cooperative, alert, well developed, in no acute distress. HEENT: Conjunctivae and lids  unremarkable. Cardiovascular: Regular rhythm.  Lungs: Normal work of breathing. Neurologic: No focal deficits.   Lab Results  Component Value Date   CREATININE 0.76 06/29/2020   BUN 13 06/29/2020   NA 137 06/29/2020   K 4.2 06/29/2020   CL 99 06/29/2020   CO2 23 06/29/2020   Lab Results  Component Value Date   ALT 15 06/29/2020   AST 18 06/29/2020   ALKPHOS 66 06/29/2020   BILITOT 1.4 (H) 06/29/2020   Lab Results  Component Value Date   HGBA1C 5.1 06/29/2020   HGBA1C 5.2 03/18/2020   HGBA1C 5.3 08/29/2019   Lab Results  Component Value Date   INSULIN 14.5 06/29/2020   INSULIN 23.1 03/18/2020   INSULIN 24.9 08/29/2019   Lab Results  Component Value Date   TSH 1.720 08/29/2019   Lab Results  Component Value Date   CHOL 192 06/29/2020   HDL 57 06/29/2020   LDLCALC 118 (H) 06/29/2020   TRIG 93 06/29/2020   CHOLHDL 3.4 06/29/2020   Lab Results  Component Value Date   VD25OH 31.5 06/29/2020   VD25OH 53.2 03/18/2020   VD25OH 25.7 (L) 08/29/2019   Lab Results  Component Value Date   WBC 8.4 08/29/2019   HGB 13.8 08/29/2019   HCT 41.3 08/29/2019   MCV 88 08/29/2019   PLT 265 08/29/2019   Attestation Statements:   Reviewed by clinician on day of visit: allergies, medications, problem list, medical history, surgical history, family history, social history, and previous encounter notes.  I, 08/31/2019, CMA, am acting as Insurance claims handler for Energy manager, DO.  I have reviewed the above documentation for accuracy and completeness, and I agree with the above. [employer], D.O.  The 21st Century Cures Act was signed into law in 2016 which includes the topic of electronic health records.  This provides immediate access to information in MyChart.  This includes consultation notes, operative notes, office notes, lab results and pathology reports.  If you have any questions about what you read please let 2017 know at your next visit so we can discuss your  concerns and take corrective action if need be.  We are right here with you.

## 2020-09-02 ENCOUNTER — Encounter (INDEPENDENT_AMBULATORY_CARE_PROVIDER_SITE_OTHER): Payer: Self-pay | Admitting: Family Medicine

## 2020-09-02 ENCOUNTER — Ambulatory Visit (INDEPENDENT_AMBULATORY_CARE_PROVIDER_SITE_OTHER): Payer: BC Managed Care – PPO | Admitting: Family Medicine

## 2020-09-02 ENCOUNTER — Other Ambulatory Visit: Payer: Self-pay

## 2020-09-02 VITALS — BP 126/69 | HR 82 | Temp 98.1°F | Ht 67.0 in | Wt 252.0 lb

## 2020-09-02 DIAGNOSIS — E559 Vitamin D deficiency, unspecified: Secondary | ICD-10-CM

## 2020-09-02 DIAGNOSIS — Z9189 Other specified personal risk factors, not elsewhere classified: Secondary | ICD-10-CM

## 2020-09-02 DIAGNOSIS — M25519 Pain in unspecified shoulder: Secondary | ICD-10-CM

## 2020-09-02 DIAGNOSIS — I10 Essential (primary) hypertension: Secondary | ICD-10-CM | POA: Diagnosis not present

## 2020-09-02 DIAGNOSIS — Z6841 Body Mass Index (BMI) 40.0 and over, adult: Secondary | ICD-10-CM

## 2020-09-02 MED ORDER — VITAMIN D (ERGOCALCIFEROL) 1.25 MG (50000 UNIT) PO CAPS: 50000.0000 [IU] | ORAL_CAPSULE | ORAL | 0 refills | Status: DC

## 2020-09-10 NOTE — Progress Notes (Signed)
Chief Complaint:   OBESITY Carla Watson is here to discuss her progress with her obesity treatment plan along with follow-up of her obesity related diagnoses.   Today's visit was #: 20 Starting weight: 267 lbs Starting date: 08/29/2019 Today's weight: 252 lbs Today's date: 09/02/2020 Weight change since last visit: 1 lb Total lbs lost to date: 15 lbs Body mass index is 39.47 kg/m.  Total weight loss percentage to date: -5.62%  Interim History:  Carla Watson's PCP took her off her diuretic recently.  Blood pressure is stable.  If she started her day on plan, she is much better staying on it for the rest of the day.  Going to see her mother and father always leads to unhealthy eating.  Sees them weekly.  Most days, she was above goal on calories (occasionally over 2000 calories per day) and below goal on protein.  Plan:  Try to stick on plan and stay on goal more days than not.  Current Meal Plan: keeping a food journal and adhering to recommended goals of 1200-1300 calories and 90 grams of protein for 60-70% of the time.  Current Exercise Plan: Walking/boot camp for 30/45 minutes 7/3 times per week. Current Anti-Obesity Medications: Saxenda. Side effects: None.  Assessment/Plan:   Medications Discontinued During This Encounter  Medication Reason   Vitamin D, Ergocalciferol, (DRISDOL) 1.25 MG (50000 UNIT) CAPS capsule Reorder   Meds ordered this encounter  Medications   Vitamin D, Ergocalciferol, (DRISDOL) 1.25 MG (50000 UNIT) CAPS capsule    Sig: Take 1 capsule (50,000 Units total) by mouth every 7 (seven) days.    Dispense:  4 capsule    Refill:  0    ** 30 day supply, needs ov for rf **    1. Essential hypertension At goal. Medications: Zestril 10 mg daily.  PCP recently took her off HCTZ due to low blood pressure and dizziness.  Blood pressure runs 115-125/70s at home now.  No symptoms or concerns.   Plan:  At goal now.  Continue medication as is.  Prudent nutritional plan and  weight loss with close monitoring of blood pressure.  Avoid buying foods that are: processed, frozen, or prepackaged to avoid excess salt. We will watch for signs of hypotension as she continues lifestyle modifications. We will continue to monitor closely alongside her PCP and/or Specialist.  Regular follow up with PCP and specialists was also encouraged.   BP Readings from Last 3 Encounters:  09/02/20 126/69  08/12/20 104/66  07/27/20 110/69   Lab Results  Component Value Date   CREATININE 0.76 06/29/2020   2. Shoulder pain, unspecified chronicity, unspecified laterality She was on prednisone for the past week or so, but lost weight despite it.    Plan:  Care per Ortho.  Counseling on prednisone use and hunger.  3. Vitamin D deficiency Not at goal.  She is taking vitamin D 50,000 IU weekly.  Plan: Continue to take prescription Vitamin D @50 ,000 IU every week as prescribed.  Follow-up for routine testing of Vitamin D, at least 2-3 times per year to avoid over-replacement.  Lab Results  Component Value Date   VD25OH 31.5 06/29/2020   VD25OH 53.2 03/18/2020   VD25OH 25.7 (L) 08/29/2019   - Refill Vitamin D, Ergocalciferol, (DRISDOL) 1.25 MG (50000 UNIT) CAPS capsule; Take 1 capsule (50,000 Units total) by mouth every 7 (seven) days.  Dispense: 4 capsule; Refill: 0  4. At risk for malnutrition Carla Watson was given extensive malnutrition prevention education and  counseling today of more than 12 minutes.  Counseled her that malnutrition refers to inappropriate nutrients or not the right balance of nutrients for optimal health.  Discussed with Carla Watson that it is absolutely possible to be malnourished but yet obese.  Risk factors, including but not limited to, inappropriate dietary choices, difficulty with obtaining food due to physical or financial limitations, and various physical and mental health conditions were reviewed with Carla Watson.   5. Class 3 severe obesity with  serious comorbidity and body mass index (BMI) of 40.0 to 44.9 in adult, unspecified obesity type (HCC)  Course: Carla Watson is currently in the action stage of change. As such, her goal is to continue with weight loss efforts.   Nutrition goals: She has agreed to keeping a food journal and adhering to recommended goals of 1200-1300 calories and 90 grams of protein.   Exercise goals:  As is.  Behavioral modification strategies: dealing with family or coworker sabotage and planning for success.  Carla Watson has agreed to follow-up with our clinic in 2-3 weeks, fasting for IC. She was informed of the importance of frequent follow-up visits to maximize her success with intensive lifestyle modifications for her multiple health conditions.   Objective:   Blood pressure 126/69, pulse 82, temperature 98.1 F (36.7 C), height 5\' 7"  (1.702 m), weight 252 lb (114.3 kg), SpO2 96 %. Body mass index is 39.47 kg/m.  General: Cooperative, alert, well developed, in no acute distress. HEENT: Conjunctivae and lids unremarkable. Cardiovascular: Regular rhythm.  Lungs: Normal work of breathing. Neurologic: No focal deficits.   Lab Results  Component Value Date   CREATININE 0.76 06/29/2020   BUN 13 06/29/2020   NA 137 06/29/2020   K 4.2 06/29/2020   CL 99 06/29/2020   CO2 23 06/29/2020   Lab Results  Component Value Date   ALT 15 06/29/2020   AST 18 06/29/2020   ALKPHOS 66 06/29/2020   BILITOT 1.4 (H) 06/29/2020   Lab Results  Component Value Date   HGBA1C 5.1 06/29/2020   HGBA1C 5.2 03/18/2020   HGBA1C 5.3 08/29/2019   Lab Results  Component Value Date   INSULIN 14.5 06/29/2020   INSULIN 23.1 03/18/2020   INSULIN 24.9 08/29/2019   Lab Results  Component Value Date   TSH 1.720 08/29/2019   Lab Results  Component Value Date   CHOL 192 06/29/2020   HDL 57 06/29/2020   LDLCALC 118 (H) 06/29/2020   TRIG 93 06/29/2020   CHOLHDL 3.4 06/29/2020   Lab Results  Component Value Date    VD25OH 31.5 06/29/2020   VD25OH 53.2 03/18/2020   VD25OH 25.7 (L) 08/29/2019   Lab Results  Component Value Date   WBC 8.4 08/29/2019   HGB 13.8 08/29/2019   HCT 41.3 08/29/2019   MCV 88 08/29/2019   PLT 265 08/29/2019   Attestation Statements:   Reviewed by clinician on day of visit: allergies, medications, problem list, medical history, surgical history, family history, social history, and previous encounter notes.  I, 08/31/2019, CMA, am acting as Insurance claims handler for Energy manager, DO.  I have reviewed the above documentation for accuracy and completeness, and I agree with the above. [employer], D.O.  The 21st Century Cures Act was signed into law in 2016 which includes the topic of electronic health records.  This provides immediate access to information in MyChart.  This includes consultation notes, operative notes, office notes, lab results and pathology reports.  If you  have any questions about what you read please let us know at your next visit so we can discuss your concerns and take corrective action if need be.  We are right here with you.

## 2020-09-28 ENCOUNTER — Ambulatory Visit (INDEPENDENT_AMBULATORY_CARE_PROVIDER_SITE_OTHER): Payer: BC Managed Care – PPO | Admitting: Family Medicine

## 2020-10-13 ENCOUNTER — Other Ambulatory Visit: Payer: Self-pay

## 2020-10-13 ENCOUNTER — Other Ambulatory Visit (INDEPENDENT_AMBULATORY_CARE_PROVIDER_SITE_OTHER): Payer: Self-pay | Admitting: Adult Health

## 2020-10-13 ENCOUNTER — Ambulatory Visit (INDEPENDENT_AMBULATORY_CARE_PROVIDER_SITE_OTHER): Payer: BC Managed Care – PPO | Admitting: Adult Health

## 2020-10-13 ENCOUNTER — Encounter (INDEPENDENT_AMBULATORY_CARE_PROVIDER_SITE_OTHER): Payer: Self-pay | Admitting: Adult Health

## 2020-10-13 VITALS — BP 129/84 | HR 80 | Temp 98.4°F | Ht 67.0 in | Wt 254.0 lb

## 2020-10-13 DIAGNOSIS — Z9189 Other specified personal risk factors, not elsewhere classified: Secondary | ICD-10-CM

## 2020-10-13 DIAGNOSIS — E559 Vitamin D deficiency, unspecified: Secondary | ICD-10-CM

## 2020-10-13 DIAGNOSIS — Z6839 Body mass index (BMI) 39.0-39.9, adult: Secondary | ICD-10-CM

## 2020-10-13 DIAGNOSIS — I10 Essential (primary) hypertension: Secondary | ICD-10-CM

## 2020-10-13 MED ORDER — VITAMIN D (ERGOCALCIFEROL) 1.25 MG (50000 UNIT) PO CAPS: 50000.0000 [IU] | ORAL_CAPSULE | ORAL | 0 refills | Status: DC

## 2020-10-13 MED ORDER — SAXENDA 18 MG/3ML ~~LOC~~ SOPN: PEN_INJECTOR | SUBCUTANEOUS | 0 refills | Status: DC

## 2020-10-14 NOTE — Progress Notes (Signed)
Chief Complaint:   OBESITY Carla Watson is here to discuss her progress with her obesity treatment plan along with follow-up of her obesity related diagnoses. Carla Watson is on the Category 2 Plan and keeping a food journal and adhering to recommended goals of 1200-1300 calories and 90 grams of protein and states she is following her eating plan approximately 50% of the time. Carla Watson states she is walking for 30-45 minutes 6 times per week.  Today's visit was #: 21 Starting weight: 267 lbs Starting date: 08/29/2019 Today's weight: 254 lbs Today's date: 10/13/2020 Total lbs lost to date: 13 lbs Total lbs lost since last in-office visit: 0  Interim History: Carla Watson is following a hybrid plan of Category 2 and journaling plan.   She injured her right shoulder and was treated with prednisone - completed course.   She is now performing a morning stretching routine.   She missed a few doses of Saxenda, but has titrated back up to 3 mg. She denies mass in neck, dysphagia, dyspepsia, or persistent hoarseness.  Subjective:   1. Vitamin D deficiency Vitamin D level on 06/29/2020 was 31.5 - below goal of 50.  She is currently taking prescription ergocalciferol D 50,000 IU each week. She denies nausea, vomiting or muscle weakness.  Lab Results  Component Value Date   VD25OH 31.5 06/29/2020   VD25OH 53.2 03/18/2020   VD25OH 25.7 (L) 08/29/2019   2. Hypertension, unspecified type PCP recently restarted HCTZ on 09/28/2020, continued lisinopril 10 mg daily. Bioimpedance reflects increase of 1.8 pounds of water weight.  BP Readings from Last 3 Encounters:  10/13/20 129/84  09/02/20 126/69  08/12/20 104/66   3. At risk for constipation Carla Watson is at increased risk for constipation due to GLP-1 therapy.    Assessment/Plan:   1. Vitamin D deficiency Low Vitamin D level contributes to fatigue and are associated with obesity, breast, and colon cancer. She agrees to continue to take prescription  ergocalciferol @50 ,000 IU every week and will follow-up for routine testing of Vitamin D, at least 2-3 times per year to avoid over-replacement.  - Refill Vitamin D, Ergocalciferol, (DRISDOL) 1.25 MG (50000 UNIT) CAPS capsule; Take 1 capsule (50,000 Units total) by mouth every 7 (seven) days.  Dispense: 4 capsule; Refill: 0  2. Hypertension, unspecified type Carla Watson is working on healthy weight loss and exercise to improve blood pressure control. We will watch for signs of hypotension as she continues her lifestyle modifications.  Continue lisinopril 10 mg daily, HCTZ 12.5 mg daily - managed by PCP.  3. At risk for constipation Carla Watson was given approximately 15 minutes of counseling today regarding prevention of constipation. She was encouraged to increase water and fiber intake.    4. Class 2 severe obesity with serious comorbidity and body mass index (BMI) of 39.0 to 39.9 in adult, unspecified obesity type (HCC)  - Refill Liraglutide -Weight Management (SAXENDA) 18 MG/3ML SOPN; Inject 3mg  once daily  Dispense: 15 mL; Refill: 0  Carla Watson is currently in the action stage of change. As such, her goal is to continue with weight loss efforts. She has agreed to the Category 2 Plan and keeping a food journal and adhering to recommended goals of 1200-1300 calories and 90 grams of protein.   Exercise goals:  As is.  Behavioral modification strategies: increasing lean protein intake, decreasing simple carbohydrates, meal planning and cooking strategies, keeping healthy foods in the home, and planning for success.  Refill Saxenda 3 mg daily, dispense 15 ,0 refills.  Check fasting labs at next office visit.  Carla Watson has agreed to follow-up with our clinic in 3 weeks, fasting. She was informed of the importance of frequent follow-up visits to maximize her success with intensive lifestyle modifications for her multiple health conditions.   Objective:   Blood pressure 129/84, pulse 80, temperature  98.4 F (36.9 C), height 5\' 7"  (1.702 m), weight 254 lb (115.2 kg), SpO2 99 %. Body mass index is 39.78 kg/m.  General: Cooperative, alert, well developed, in no acute distress. HEENT: Conjunctivae and lids unremarkable. Cardiovascular: Regular rhythm.  Lungs: Normal work of breathing. Neurologic: No focal deficits.   Lab Results  Component Value Date   CREATININE 0.76 06/29/2020   BUN 13 06/29/2020   NA 137 06/29/2020   K 4.2 06/29/2020   CL 99 06/29/2020   CO2 23 06/29/2020   Lab Results  Component Value Date   ALT 15 06/29/2020   AST 18 06/29/2020   ALKPHOS 66 06/29/2020   BILITOT 1.4 (H) 06/29/2020   Lab Results  Component Value Date   HGBA1C 5.1 06/29/2020   HGBA1C 5.2 03/18/2020   HGBA1C 5.3 08/29/2019   Lab Results  Component Value Date   INSULIN 14.5 06/29/2020   INSULIN 23.1 03/18/2020   INSULIN 24.9 08/29/2019   Lab Results  Component Value Date   TSH 1.720 08/29/2019   Lab Results  Component Value Date   CHOL 192 06/29/2020   HDL 57 06/29/2020   LDLCALC 118 (H) 06/29/2020   TRIG 93 06/29/2020   CHOLHDL 3.4 06/29/2020   Lab Results  Component Value Date   VD25OH 31.5 06/29/2020   VD25OH 53.2 03/18/2020   VD25OH 25.7 (L) 08/29/2019   Lab Results  Component Value Date   WBC 8.4 08/29/2019   HGB 13.8 08/29/2019   HCT 41.3 08/29/2019   MCV 88 08/29/2019   PLT 265 08/29/2019   Attestation Statements:   Reviewed by clinician on day of visit: allergies, medications, problem list, medical history, surgical history, family history, social history, and previous encounter notes.  I, 08/31/2019, CMA, am acting as Insurance claims handler for Energy manager, NP.  I have reviewed the above documentation for accuracy and completeness, and I agree with the above. -  Zeniah Briney d. Breane Grunwald, NP-C

## 2020-11-02 ENCOUNTER — Ambulatory Visit (INDEPENDENT_AMBULATORY_CARE_PROVIDER_SITE_OTHER): Payer: BC Managed Care – PPO | Admitting: Family Medicine

## 2020-11-02 ENCOUNTER — Other Ambulatory Visit: Payer: Self-pay

## 2020-11-02 ENCOUNTER — Encounter (INDEPENDENT_AMBULATORY_CARE_PROVIDER_SITE_OTHER): Payer: Self-pay | Admitting: Family Medicine

## 2020-11-02 VITALS — BP 113/70 | HR 73 | Temp 98.1°F | Ht 67.0 in | Wt 254.0 lb

## 2020-11-02 DIAGNOSIS — I10 Essential (primary) hypertension: Secondary | ICD-10-CM

## 2020-11-02 DIAGNOSIS — Z6841 Body Mass Index (BMI) 40.0 and over, adult: Secondary | ICD-10-CM

## 2020-11-02 DIAGNOSIS — E559 Vitamin D deficiency, unspecified: Secondary | ICD-10-CM | POA: Diagnosis not present

## 2020-11-02 DIAGNOSIS — Z9189 Other specified personal risk factors, not elsewhere classified: Secondary | ICD-10-CM | POA: Diagnosis not present

## 2020-11-02 DIAGNOSIS — E88819 Insulin resistance, unspecified: Secondary | ICD-10-CM

## 2020-11-02 DIAGNOSIS — E8881 Metabolic syndrome: Secondary | ICD-10-CM | POA: Diagnosis not present

## 2020-11-02 DIAGNOSIS — E66813 Obesity, class 3: Secondary | ICD-10-CM

## 2020-11-02 MED ORDER — VITAMIN D (ERGOCALCIFEROL) 1.25 MG (50000 UNIT) PO CAPS: 50000.0000 [IU] | ORAL_CAPSULE | ORAL | 0 refills | Status: DC

## 2020-11-02 NOTE — Progress Notes (Signed)
Chief Complaint:   OBESITY Carla Watson is here to discuss her progress with her obesity treatment plan along with follow-up of her obesity related diagnoses. Carla Watson is on the Category 2 Plan and keeping a food journal and adhering to recommended goals of 1200-1300 calories and 90 grams protein and states she is following her eating plan approximately 70% of the time. Carla Watson states she is walking and strength training 30 minutes 2-6 times per week.  Today's visit was #: 22 Starting weight: 267 lbs Starting date: 08/29/2019 Today's weight: 254 lbs Today's date: 11/02/2020 Total lbs lost to date: 13 Total lbs lost since last in-office visit: 0  Interim History: No change in weight since last OV. Carla Watson's last OV was with me on 09/02/2020. She has made little changes- increased water intake. 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 She struggles with dinner meals and planning. Pt denies issues with plan.   Subjective:   1. Essential hypertension At goal. Carla Watson's PCP restarted HCTZ and lisinopril 1-2 months ago.  2. Insulin resistance Pt has been on Saxenda 3 mg for 1.5-2 months ago.  3. Vitamin D deficiency She is currently taking prescription vitamin D 50,000 IU each week. She denies nausea, vomiting or muscle weakness.  4. At risk for impaired metabolic function Carla Watson is at increased risk for impaired metabolic function due to current nutrition and muscle mass.  Assessment/Plan:   Orders Placed This Encounter  Procedures   VITAMIN D 25 Hydroxy (Vit-D Deficiency, Fractures)    Medications Discontinued During This Encounter  Medication Reason   Vitamin D, Ergocalciferol, (DRISDOL) 1.25 MG (50000 UNIT) CAPS capsule Reorder     Meds ordered this encounter  Medications   Vitamin D, Ergocalciferol, (DRISDOL) 1.25 MG (50000 UNIT) CAPS capsule    Sig: Take 1 capsule (50,000 Units total) by mouth every 7 (seven) days.    Dispense:  4 capsule    Refill:  0    ** 30  day supply, needs ov for rf **     1. Essential hypertension Carla Watson is working on healthy weight loss and exercise to improve blood pressure control. We will watch for signs of hypotension as she continues her lifestyle modifications.  2. Insulin resistance Carla Watson will continue to work on weight loss, exercise, and decreasing simple carbohydrates to help decrease the risk of diabetes. Carla Watson agreed to follow-up with [name] as directed to closely monitor her progress. Pt denies refill of Saxenda.  3. Vitamin D deficiency Low Vitamin D level contributes to fatigue and are associated with obesity, breast, and colon cancer. She agrees to continue to take prescription Vitamin D 50,000 IU every week and will follow-up for routine testing of Vitamin D, at least 2-3 times per year to avoid over-replacement. Check Vit D level today.  Refill- Vitamin D, Ergocalciferol, (DRISDOL) 1.25 MG (50000 UNIT) CAPS capsule; Take 1 capsule (50,000 Units total) by mouth every 7 (seven) days.  Dispense: 4 capsule; Refill: 0  - VITAMIN D 25 Hydroxy (Vit-D Deficiency, Fractures)  4. At risk for impaired metabolic function Carla Watson was given approximately 9 minutes of impaired  metabolic function prevention counseling today. We discussed intensive lifestyle modifications today with an emphasis on specific nutrition and exercise instructions and strategies.   Repetitive spaced learning was employed today to elicit superior memory formation and behavioral change.  5. Obesity with current BMI of 39.8  Carla Watson is currently in the action stage of change. As such, her goal is to continue  with weight loss efforts. She has agreed to the Category 2 Plan and keeping a food journal and adhering to recommended goals of 1200-1300 calories and 90 grams protein.   Recipes provided- packet II and meal prep discussed with pt.  Exercise goals: For substantial health benefits, adults should do at least 150 minutes (2 hours and 30  minutes) a week of moderate-intensity, or 75 minutes (1 hour and 15 minutes) a week of vigorous-intensity aerobic physical activity, or an equivalent combination of moderate- and vigorous-intensity aerobic activity. Aerobic activity should be performed in episodes of at least 10 minutes, and preferably, it should be spread throughout the week.  Behavioral modification strategies: meal planning and cooking strategies and planning for success.  Carla Watson has agreed to follow-up with our clinic in 3 weeks. She was informed of the importance of frequent follow-up visits to maximize her success with intensive lifestyle modifications for her multiple health conditions.   Objective:   Blood pressure 113/70, pulse 73, temperature 98.1 F (36.7 C), height 5\' 7"  (1.702 m), weight 254 lb (115.2 kg), SpO2 97 %. Body mass index is 39.78 kg/m.  General: Cooperative, alert, well developed, in no acute distress. HEENT: Conjunctivae and lids unremarkable. Cardiovascular: Regular rhythm.  Lungs: Normal work of breathing. Neurologic: No focal deficits.   Lab Results  Component Value Date   CREATININE 0.76 06/29/2020   BUN 13 06/29/2020   NA 137 06/29/2020   K 4.2 06/29/2020   CL 99 06/29/2020   CO2 23 06/29/2020   Lab Results  Component Value Date   ALT 15 06/29/2020   AST 18 06/29/2020   ALKPHOS 66 06/29/2020   BILITOT 1.4 (H) 06/29/2020   Lab Results  Component Value Date   HGBA1C 5.1 06/29/2020   HGBA1C 5.2 03/18/2020   HGBA1C 5.3 08/29/2019   Lab Results  Component Value Date   INSULIN 14.5 06/29/2020   INSULIN 23.1 03/18/2020   INSULIN 24.9 08/29/2019   Lab Results  Component Value Date   TSH 1.720 08/29/2019   Lab Results  Component Value Date   CHOL 192 06/29/2020   HDL 57 06/29/2020   LDLCALC 118 (H) 06/29/2020   TRIG 93 06/29/2020   CHOLHDL 3.4 06/29/2020   Lab Results  Component Value Date   VD25OH 31.5 06/29/2020   VD25OH 53.2 03/18/2020   VD25OH 25.7 (L)  08/29/2019   Lab Results  Component Value Date   WBC 8.4 08/29/2019   HGB 13.8 08/29/2019   HCT 41.3 08/29/2019   MCV 88 08/29/2019   PLT 265 08/29/2019    Attestation Statements:   Reviewed by clinician on day of visit: allergies, medications, problem list, medical history, surgical history, family history, social history, and previous encounter notes.  08/31/2019, CMA, am acting as transcriptionist for Edmund Hilda, DO.  I have reviewed the above documentation for accuracy and completeness, and I agree with the above. [employer], D.O.  The 21st Century Cures Act was signed into law in 2016 which includes the topic of electronic health records.  This provides immediate access to information in MyChart.  This includes consultation notes, operative notes, office notes, lab results and pathology reports.  If you have any questions about what you read please let 2017 know at your next visit so we can discuss your concerns and take corrective action if need be.  We are right here with you.

## 2020-11-03 LAB — VITAMIN D 25 HYDROXY (VIT D DEFICIENCY, FRACTURES): Vit D, 25-Hydroxy: 41.3 ng/mL (ref 30.0–100.0)

## 2020-11-23 ENCOUNTER — Ambulatory Visit (INDEPENDENT_AMBULATORY_CARE_PROVIDER_SITE_OTHER): Payer: BC Managed Care – PPO | Admitting: Physician Assistant

## 2020-11-24 ENCOUNTER — Ambulatory Visit (INDEPENDENT_AMBULATORY_CARE_PROVIDER_SITE_OTHER): Payer: BC Managed Care – PPO | Admitting: Family Medicine

## 2020-12-09 ENCOUNTER — Encounter (INDEPENDENT_AMBULATORY_CARE_PROVIDER_SITE_OTHER): Payer: Self-pay | Admitting: Bariatrics

## 2020-12-09 ENCOUNTER — Ambulatory Visit (INDEPENDENT_AMBULATORY_CARE_PROVIDER_SITE_OTHER): Payer: BC Managed Care – PPO | Admitting: Bariatrics

## 2020-12-09 ENCOUNTER — Other Ambulatory Visit: Payer: Self-pay

## 2020-12-09 VITALS — BP 112/76 | HR 80 | Temp 98.0°F | Ht 67.0 in | Wt 256.0 lb

## 2020-12-09 DIAGNOSIS — I10 Essential (primary) hypertension: Secondary | ICD-10-CM | POA: Diagnosis not present

## 2020-12-09 DIAGNOSIS — E559 Vitamin D deficiency, unspecified: Secondary | ICD-10-CM

## 2020-12-09 DIAGNOSIS — Z6841 Body Mass Index (BMI) 40.0 and over, adult: Secondary | ICD-10-CM | POA: Diagnosis not present

## 2020-12-09 MED ORDER — VITAMIN D (ERGOCALCIFEROL) 1.25 MG (50000 UNIT) PO CAPS: 50000.0000 [IU] | ORAL_CAPSULE | ORAL | 0 refills | Status: DC

## 2020-12-09 MED ORDER — SAXENDA 18 MG/3ML ~~LOC~~ SOPN: PEN_INJECTOR | SUBCUTANEOUS | 0 refills | Status: DC

## 2020-12-10 NOTE — Progress Notes (Signed)
Chief Complaint:   OBESITY Carla Watson is here to discuss her progress with her obesity treatment plan along with follow-up of her obesity related diagnoses. Carla Watson is on the Category 2 Plan and states she is following her eating plan approximately 60% of the time. Carla Watson states she is walking 20-30 minutes 3-4 times per week.  Today's visit was #: 23 Starting weight: 267 lbs Starting date: 08/29/2019 Today's weight: 256 lbs Today's date: 12/09/2020 Total lbs lost to date: 11 Total lbs lost since last in-office visit: 0  Interim History: Carla Watson is up 2 lbs since her last visit. She had a rough month- had COVID-19. Her body water is up about 2 lbs per bio-impedence scale.  Subjective:   1. Vitamin D deficiency Carla Watson is taking prescription Vit D as directed.  2. Hypertension, unspecified type BP controlled. She is taking HCTZ and Zestril.  Assessment/Plan:   1. Vitamin D deficiency Low Vitamin D level contributes to fatigue and are associated with obesity, breast, and colon cancer. She agrees to continue to take prescription Vitamin D 50,000 IU every week and will follow-up for routine testing of Vitamin D, at least 2-3 times per year to avoid over-replacement.  Refill- Vitamin D, Ergocalciferol, (DRISDOL) 1.25 MG (50000 UNIT) CAPS capsule; Take 1 capsule (50,000 Units total) by mouth every 7 (seven) days.  Dispense: 4 capsule; Refill: 0  2. Hypertension, unspecified type Carla Watson is working on healthy weight loss and exercise to improve blood pressure control. We will watch for signs of hypotension as she continues her lifestyle modifications. Continue current treatment plan.  3. Obesity with current BMI of 40.2  Carla Watson is currently in the action stage of change. As such, her goal is to continue with weight loss efforts. She has agreed to the Category 2 Plan.   We discussed various medication options to help Carla Watson with her weight loss efforts and we both agreed to  continue Saxenda. Refill- Liraglutide -Weight Management (SAXENDA) 18 MG/3ML SOPN; Inject 3mg  once daily  Dispense: 15 mL; Refill: 0  Meal planning and intentional eating. Exercise goals:  As is-get back to workouts.  Behavioral modification strategies: increasing lean protein intake, decreasing simple carbohydrates, increasing vegetables, increasing water intake, decreasing eating out, no skipping meals, meal planning and cooking strategies, keeping healthy foods in the home, and planning for success.  Carla Watson has agreed to follow-up with our clinic in 3 weeks. She was informed of the importance of frequent follow-up visits to maximize her success with intensive lifestyle modifications for her multiple health conditions.   Objective:   Blood pressure 112/76, pulse 80, temperature 98 F (36.7 C), height 5\' 7"  (1.702 m), weight 256 lb (116.1 kg), SpO2 97 %. Body mass index is 40.1 kg/m.  General: Cooperative, alert, well developed, in no acute distress. HEENT: Conjunctivae and lids unremarkable. Cardiovascular: Regular rhythm.  Lungs: Normal work of breathing. Neurologic: No focal deficits.   Lab Results  Component Value Date   CREATININE 0.76 06/29/2020   BUN 13 06/29/2020   NA 137 06/29/2020   K 4.2 06/29/2020   CL 99 06/29/2020   CO2 23 06/29/2020   Lab Results  Component Value Date   ALT 15 06/29/2020   AST 18 06/29/2020   ALKPHOS 66 06/29/2020   BILITOT 1.4 (H) 06/29/2020   Lab Results  Component Value Date   HGBA1C 5.1 06/29/2020   HGBA1C 5.2 03/18/2020   HGBA1C 5.3 08/29/2019   Lab Results  Component Value Date   INSULIN  14.5 06/29/2020   INSULIN 23.1 03/18/2020   INSULIN 24.9 08/29/2019   Lab Results  Component Value Date   TSH 1.720 08/29/2019   Lab Results  Component Value Date   CHOL 192 06/29/2020   HDL 57 06/29/2020   LDLCALC 118 (H) 06/29/2020   TRIG 93 06/29/2020   CHOLHDL 3.4 06/29/2020   Lab Results  Component Value Date   VD25OH 41.3  11/02/2020   VD25OH 31.5 06/29/2020   VD25OH 53.2 03/18/2020   Lab Results  Component Value Date   WBC 8.4 08/29/2019   HGB 13.8 08/29/2019   HCT 41.3 08/29/2019   MCV 88 08/29/2019   PLT 265 08/29/2019    Attestation Statements:   Reviewed by clinician on day of visit: allergies, medications, problem list, medical history, surgical history, family history, social history, and previous encounter notes.  Edmund Hilda, CMA, am acting as Energy manager for [employer], DO.  I have reviewed the above documentation for accuracy and completeness, and I agree with the above. Corinna Capra, DO

## 2020-12-11 ENCOUNTER — Encounter (INDEPENDENT_AMBULATORY_CARE_PROVIDER_SITE_OTHER): Payer: Self-pay | Admitting: Bariatrics

## 2020-12-22 ENCOUNTER — Ambulatory Visit (INDEPENDENT_AMBULATORY_CARE_PROVIDER_SITE_OTHER): Payer: BC Managed Care – PPO | Admitting: Family Medicine

## 2020-12-22 ENCOUNTER — Other Ambulatory Visit: Payer: Self-pay

## 2020-12-22 ENCOUNTER — Encounter (INDEPENDENT_AMBULATORY_CARE_PROVIDER_SITE_OTHER): Payer: Self-pay | Admitting: Family Medicine

## 2020-12-22 VITALS — BP 119/79 | HR 75 | Temp 98.0°F | Ht 67.0 in | Wt 254.0 lb

## 2020-12-22 DIAGNOSIS — Z9189 Other specified personal risk factors, not elsewhere classified: Secondary | ICD-10-CM | POA: Diagnosis not present

## 2020-12-22 DIAGNOSIS — E8881 Metabolic syndrome: Secondary | ICD-10-CM

## 2020-12-22 DIAGNOSIS — E7849 Other hyperlipidemia: Secondary | ICD-10-CM | POA: Diagnosis not present

## 2020-12-22 DIAGNOSIS — E559 Vitamin D deficiency, unspecified: Secondary | ICD-10-CM

## 2020-12-22 DIAGNOSIS — Z6841 Body Mass Index (BMI) 40.0 and over, adult: Secondary | ICD-10-CM

## 2020-12-22 MED ORDER — VITAMIN D (ERGOCALCIFEROL) 1.25 MG (50000 UNIT) PO CAPS: 50000.0000 [IU] | ORAL_CAPSULE | ORAL | 0 refills | Status: DC

## 2020-12-22 MED ORDER — BD PEN NEEDLE NANO 2ND GEN 32G X 4 MM MISC: 0 refills | Status: AC

## 2020-12-22 NOTE — Progress Notes (Signed)
Chief Complaint:   OBESITY Carla Watson is here to discuss her progress with her obesity treatment plan along with follow-up of her obesity related diagnoses. Carla Watson is on the Category 2 Plan and states she is following her eating plan approximately 80% of the time. Carla Watson states she is walking for 30 minutes 3-4 times per week.  Today's visit was #: 24 Starting weight: 267 lbs Starting date: 08/29/2019 Today's weight: 254 lbs Today's date: 12/22/2020 Total lbs lost to date: 13 Total lbs lost since last in-office visit: 2  Interim History: Carla Watson has done better with her food choices, and she is sticking to the plan for lunch and dinner really well.  Subjective:   1. Vitamin D deficiency Carla Watson is currently taking prescription vitamin D 50,000 IU each week. She denies nausea, vomiting or muscle weakness.  2. Insulin resistance Carla Watson has a diagnosis of insulin resistance based on her elevated fasting insulin level >5. She is not on any medications other than Saxenda. She continues to work on diet and exercise to decrease her risk of diabetes.  3. Other hyperlipidemia Carla Watson has hyperlipidemia and her last LDL was elevated, and she is not on medications. She recently had coronary cat scores that were not reassuring. She has been trying to improve her cholesterol levels with intensive lifestyle modification including a low saturated fat diet, exercise and weight loss. She denies any chest pain, claudication or myalgias.  4. At risk for heart disease Carla Watson is at a higher than average risk for cardiovascular disease due to insulin resistance and hyperlipidemia.  Assessment/Plan:   Orders Placed This Encounter  Procedures   Comprehensive metabolic panel   Hemoglobin A1c   Insulin, random   Lipid Panel With LDL/HDL Ratio   TSH    Medications Discontinued During This Encounter  Medication Reason   Insulin Pen Needle (BD PEN NEEDLE NANO 2ND GEN) 32G X 4 MM MISC Reorder    Vitamin D, Ergocalciferol, (DRISDOL) 1.25 MG (50000 UNIT) CAPS capsule Reorder     Meds ordered this encounter  Medications   Vitamin D, Ergocalciferol, (DRISDOL) 1.25 MG (50000 UNIT) CAPS capsule    Sig: Take 1 capsule (50,000 Units total) by mouth every 7 (seven) days.    Dispense:  4 capsule    Refill:  0    ** 30 day supply, needs ov for rf **   Insulin Pen Needle (BD PEN NEEDLE NANO 2ND GEN) 32G X 4 MM MISC    Sig: Use 1 needle daily to inject Saxenda.    Dispense:  100 each    Refill:  0     1. Vitamin D deficiency Low Vitamin D level contributes to fatigue and are associated with obesity, breast, and colon cancer. We will refill prescription Vitamin D for 1 month, and Giavanni will follow-up for routine testing of Vitamin D, at least 2-3 times per year to avoid over-replacement.  - Vitamin D, Ergocalciferol, (DRISDOL) 1.25 MG (50000 UNIT) CAPS capsule; Take 1 capsule (50,000 Units total) by mouth every 7 (seven) days.  Dispense: 4 capsule; Refill: 0  2. Insulin resistance Alma will continue to work on weight loss, exercise, and decreasing simple carbohydrates to help decrease the risk of diabetes. We will recheck labs at her next office visit. Carla Watson agreed to follow-up with [name] as directed to closely monitor her progress.  - Comprehensive metabolic panel - Hemoglobin A1c - Insulin, random - TSH  3. Other hyperlipidemia Cardiovascular risk and specific lipid/LDL goals reviewed.  We discussed several lifestyle modifications today. We will recheck labs at her next office visit. Carla Watson will continue her prudent nutritional plan, and increase her exercise. Orders and follow up as documented in patient record.   Counseling Intensive lifestyle modifications are the first line treatment for this issue. Dietary changes: Increase soluble fiber. Decrease simple carbohydrates. Exercise changes: Moderate to vigorous-intensity aerobic activity 150 minutes per week if  tolerated. Lipid-lowering medications: see documented in medical record.  - Lipid Panel With LDL/HDL Ratio  4. At risk for heart disease Carla Watson was given approximately 9 minutes of coronary artery disease prevention counseling today. She is 40 y.o. female and has risk factors for heart disease including obesity. We discussed intensive lifestyle modifications today with an emphasis on specific weight loss instructions and strategies.   Repetitive spaced learning was employed today to elicit superior memory formation and behavioral change.  5. Obesity with current BMI of 40.2 Carla Watson is currently in the action stage of change. As such, her goal is to continue with weight loss efforts. She has agreed to the Category 2 Plan.   We will recheck fasting labs and IC at her next office visit.  We discussed various medication options to help Carla Watson with her weight loss efforts and we both agreed to continue Saxenda, as it helps decrease cravings and controls hunger, but she is able to eat food on the plan in general. We will refill pen needles #100 with no refills.  - Insulin Pen Needle (BD PEN NEEDLE NANO 2ND GEN) 32G X 4 MM MISC; Use 1 needle daily to inject Saxenda.  Dispense: 100 each; Refill: 0  Exercise goals: As is.  Behavioral modification strategies: increasing lean protein intake and decreasing simple carbohydrates.  Carla Watson has agreed to follow-up with our clinic in 3 weeks. She was informed of the importance of frequent follow-up visits to maximize her success with intensive lifestyle modifications for her multiple health conditions.   Objective:   Blood pressure 119/79, pulse 75, temperature 98 F (36.7 C), height 5\' 7"  (1.702 m), weight 254 lb (115.2 kg), SpO2 97 %. Body mass index is 39.78 kg/m.  General: Cooperative, alert, well developed, in no acute distress. HEENT: Conjunctivae and lids unremarkable. Cardiovascular: Regular rhythm.  Lungs: Normal work of  breathing. Neurologic: No focal deficits.   Lab Results  Component Value Date   CREATININE 0.76 06/29/2020   BUN 13 06/29/2020   NA 137 06/29/2020   K 4.2 06/29/2020   CL 99 06/29/2020   CO2 23 06/29/2020   Lab Results  Component Value Date   ALT 15 06/29/2020   AST 18 06/29/2020   ALKPHOS 66 06/29/2020   BILITOT 1.4 (H) 06/29/2020   Lab Results  Component Value Date   HGBA1C 5.1 06/29/2020   HGBA1C 5.2 03/18/2020   HGBA1C 5.3 08/29/2019   Lab Results  Component Value Date   INSULIN 14.5 06/29/2020   INSULIN 23.1 03/18/2020   INSULIN 24.9 08/29/2019   Lab Results  Component Value Date   TSH 1.720 08/29/2019   Lab Results  Component Value Date   CHOL 192 06/29/2020   HDL 57 06/29/2020   LDLCALC 118 (H) 06/29/2020   TRIG 93 06/29/2020   CHOLHDL 3.4 06/29/2020   Lab Results  Component Value Date   VD25OH 41.3 11/02/2020   VD25OH 31.5 06/29/2020   VD25OH 53.2 03/18/2020   Lab Results  Component Value Date   WBC 8.4 08/29/2019   HGB 13.8 08/29/2019   HCT 41.3  08/29/2019   MCV 88 08/29/2019   PLT 265 08/29/2019   No results found for: IRON, TIBC, FERRITIN  Attestation Statements:   Reviewed by clinician on day of visit: allergies, medications, problem list, medical history, surgical history, family history, social history, and previous encounter notes.   Trude Mcburney, am acting as transcriptionist for [employer], DO.  I have reviewed the above documentation for accuracy and completeness, and I agree with the above. Carlye Grippe, D.O.  The 21st Century Cures Act was signed into law in 2016 which includes the topic of electronic health records.  This provides immediate access to information in MyChart.  This includes consultation notes, operative notes, office notes, lab results and pathology reports.  If you have any questions about what you read please let us know at your next visit so we can discuss your concerns and take corrective  action if need be.  We are right here with you.

## 2021-01-21 ENCOUNTER — Other Ambulatory Visit: Payer: Self-pay

## 2021-01-21 ENCOUNTER — Ambulatory Visit (INDEPENDENT_AMBULATORY_CARE_PROVIDER_SITE_OTHER): Payer: BC Managed Care – PPO | Admitting: Family Medicine

## 2021-01-21 ENCOUNTER — Encounter (INDEPENDENT_AMBULATORY_CARE_PROVIDER_SITE_OTHER): Payer: Self-pay | Admitting: Family Medicine

## 2021-01-21 VITALS — BP 112/71 | HR 79 | Temp 98.0°F | Ht 67.0 in | Wt 253.0 lb

## 2021-01-21 DIAGNOSIS — Z9189 Other specified personal risk factors, not elsewhere classified: Secondary | ICD-10-CM | POA: Diagnosis not present

## 2021-01-21 DIAGNOSIS — Z6841 Body Mass Index (BMI) 40.0 and over, adult: Secondary | ICD-10-CM

## 2021-01-21 DIAGNOSIS — R0602 Shortness of breath: Secondary | ICD-10-CM | POA: Diagnosis not present

## 2021-01-21 DIAGNOSIS — R632 Polyphagia: Secondary | ICD-10-CM

## 2021-01-21 DIAGNOSIS — E559 Vitamin D deficiency, unspecified: Secondary | ICD-10-CM

## 2021-01-21 MED ORDER — VITAMIN D (ERGOCALCIFEROL) 1.25 MG (50000 UNIT) PO CAPS: 50000.0000 [IU] | ORAL_CAPSULE | ORAL | 0 refills | Status: DC

## 2021-01-21 NOTE — Progress Notes (Signed)
Chief Complaint:   OBESITY Carla Watson is here to discuss her progress with her obesity treatment plan along with follow-up of her obesity related diagnoses. Carla Watson is on the Category 2 Plan and states she is following her eating plan approximately 80% of the time. Carla Watson states she is walking and doing Boot camp 25-45 minutes 4-7 times per week.  Today's visit was #: 25 Starting weight: 267 lbs Starting date: 08/29/2019 Today's weight: 253 lbs Today's date: 01/21/2021 Total lbs lost to date: 14 Total lbs lost since last in-office visit: 1  Interim History: Carla Watson is here for a follow up office visit.  We reviewed her meal plan and questions were answered.  Patient's food recall appears to be accurate and consistent with what is on plan when she is following it.   When eating on plan, her hunger and cravings are well controlled.   Pt has been lifting weights and making sure she exercises doing burn boot camp.  Subjective:   1. Shortness of breath on exertion Carla Watson notes increasing shortness of breath with exercising and seems to be worsening over time with weight gain. She notes getting out of breath sooner with activity than she used to. This has gotten worse recently. Carla Watson denies shortness of breath at rest or orthopnea.  2. Carla Watson endorses excessive hunger.   3. Vitamin D deficiency She is currently taking prescription vitamin D 50,000 IU each week. She denies nausea, vomiting or muscle weakness.  4. At risk for side effect of medication Carla Watson is at risk for side effects of medication due to pt skipping doses of Saxenda then restarting it.  Assessment/Plan:  No orders of the defined types were placed in this encounter.   Medications Discontinued During This Encounter  Medication Reason   Vitamin D, Ergocalciferol, (DRISDOL) 1.25 MG (50000 UNIT) CAPS capsule Reorder     Meds ordered this encounter  Medications   Vitamin D, Ergocalciferol,  (DRISDOL) 1.25 MG (50000 UNIT) CAPS capsule    Sig: Take 1 capsule (50,000 Units total) by mouth every 7 (seven) days.    Dispense:  4 capsule    Refill:  0    ** 30 day supply, needs ov for rf **     1. Shortness of breath on exertion Carla Watson does feel that she gets out of breath more easily that she used to when she exercises. Carla Watson's shortness of breath appears to be obesity related and exercise induced. She has agreed to work on weight loss and gradually increase exercise to treat her exercise induced shortness of breath. Will continue to monitor closely. Recheck IC today.  2. Polyphagia Intensive lifestyle modifications are the first line treatment for this issue. We discussed several lifestyle modifications today and she will continue to work on diet, exercise and weight loss efforts. Orders and follow up as documented in patient record. Continue Saxenda but titrate slowly up to 3 mg (change dose every 3-4 days) over time. Medication and food counseling done. Increase protein intake.  Counseling Polyphagia is excessive hunger. Causes can include: low blood sugars, hypERthyroidism, PMS, lack of sleep, stress, insulin resistance, diabetes, certain medications, and diets that are deficient in protein and fiber.   3. Vitamin D deficiency Low Vitamin D level contributes to fatigue and are associated with obesity, breast, and colon cancer. She agrees to continue to take prescription Vitamin D 50,000 IU every week and will follow-up for routine testing of Vitamin D, at least 2-3 times per  year to avoid over-replacement.  Refill- Vitamin D, Ergocalciferol, (DRISDOL) 1.25 MG (50000 UNIT) CAPS capsule; Take 1 capsule (50,000 Units total) by mouth every 7 (seven) days.  Dispense: 4 capsule; Refill: 0  4. At risk for side effect of medication Carla Watson was given approximately >8 minutes of drug side effect counseling today.  We discussed side effect possibility and risk versus benefits. Carla Watson  agreed to the medication and will contact this office if these side effects are intolerable.  Repetitive spaced learning was employed today to elicit superior memory formation and behavioral change.  5. Obesity with current BMI of 39.6  Carla Watson is currently in the action stage of change. As such, her goal is to continue with weight loss efforts. She has agreed to increase to the Category 3 Plan based on new IC reading today   Review labs at next OV.   Exercise goals:  As is  Behavioral modification strategies: increasing lean protein intake, avoiding temptations, and planning for success.  Carla Watson has agreed to follow-up with our clinic in 2-4 weeks. She was informed of the importance of frequent follow-up visits to maximize her success with intensive lifestyle modifications for her multiple health conditions.   Objective:   Blood pressure 112/71, pulse 79, temperature 98 F (36.7 C), height 5\' 7"  (1.702 m), weight 253 lb (114.8 kg), SpO2 96 %. Body mass index is 39.63 kg/m.  General: Cooperative, alert, well developed, in no acute distress. HEENT: Conjunctivae and lids unremarkable. Cardiovascular: Regular rhythm.  Lungs: Normal work of breathing. Neurologic: No focal deficits.   Lab Results  Component Value Date   CREATININE 0.76 06/29/2020   BUN 13 06/29/2020   NA 137 06/29/2020   K 4.2 06/29/2020   CL 99 06/29/2020   CO2 23 06/29/2020   Lab Results  Component Value Date   ALT 15 06/29/2020   AST 18 06/29/2020   ALKPHOS 66 06/29/2020   BILITOT 1.4 (H) 06/29/2020   Lab Results  Component Value Date   HGBA1C 5.1 06/29/2020   HGBA1C 5.2 03/18/2020   HGBA1C 5.3 08/29/2019   Lab Results  Component Value Date   INSULIN 14.5 06/29/2020   INSULIN 23.1 03/18/2020   INSULIN 24.9 08/29/2019   Lab Results  Component Value Date   TSH 1.720 08/29/2019   Lab Results  Component Value Date   CHOL 192 06/29/2020   HDL 57 06/29/2020   LDLCALC 118 (H) 06/29/2020    TRIG 93 06/29/2020   CHOLHDL 3.4 06/29/2020   Lab Results  Component Value Date   VD25OH 41.3 11/02/2020   VD25OH 31.5 06/29/2020   VD25OH 53.2 03/18/2020   Lab Results  Component Value Date   WBC 8.4 08/29/2019   HGB 13.8 08/29/2019   HCT 41.3 08/29/2019   MCV 88 08/29/2019   PLT 265 08/29/2019    Attestation Statements:   Reviewed by clinician on day of visit: allergies, medications, problem list, medical history, surgical history, family history, social history, and previous encounter notes.  08/31/2019, CMA, am acting as transcriptionist for Edmund Hilda, DO.  I have reviewed the above documentation for accuracy and completeness, and I agree with the above. [employer], D.O.  The 21st Century Cures Act was signed into law in 2016 which includes the topic of electronic health records.  This provides immediate access to information in MyChart.  This includes consultation notes, operative notes, office notes, lab results and pathology reports.  If you have any questions about  what you read please let us know at your next visit so we can discuss your concerns and take corrective action if need be.  We are right here with you.

## 2021-01-22 LAB — COMPREHENSIVE METABOLIC PANEL
ALT: 14 IU/L (ref 0–32)
AST: 19 IU/L (ref 0–40)
Albumin/Globulin Ratio: 1.7 (ref 1.2–2.2)
Albumin: 4.8 g/dL (ref 3.8–4.8)
Alkaline Phosphatase: 66 IU/L (ref 44–121)
BUN/Creatinine Ratio: 22 (ref 9–23)
BUN: 16 mg/dL (ref 6–20)
Bilirubin Total: 1.4 mg/dL — ABNORMAL HIGH (ref 0.0–1.2)
CO2: 24 mmol/L (ref 20–29)
Calcium: 9.6 mg/dL (ref 8.7–10.2)
Chloride: 98 mmol/L (ref 96–106)
Creatinine, Ser: 0.74 mg/dL (ref 0.57–1.00)
Globulin, Total: 2.8 g/dL (ref 1.5–4.5)
Glucose: 79 mg/dL (ref 70–99)
Potassium: 3.8 mmol/L (ref 3.5–5.2)
Sodium: 137 mmol/L (ref 134–144)
Total Protein: 7.6 g/dL (ref 6.0–8.5)
eGFR: 105 mL/min/{1.73_m2} (ref >59)

## 2021-01-22 LAB — LIPID PANEL WITH LDL/HDL RATIO
Cholesterol, Total: 220 mg/dL — ABNORMAL HIGH (ref 100–199)
HDL: 70 mg/dL (ref >39)
LDL Chol Calc (NIH): 134 mg/dL — ABNORMAL HIGH (ref 0–99)
LDL/HDL Ratio: 1.9 ratio (ref 0.0–3.2)
Triglycerides: 90 mg/dL (ref 0–149)
VLDL Cholesterol Cal: 16 mg/dL (ref 5–40)

## 2021-01-22 LAB — HEMOGLOBIN A1C
Est. average glucose Bld gHb Est-mCnc: 97 mg/dL
Hgb A1c MFr Bld: 5 % (ref 4.8–5.6)

## 2021-01-22 LAB — INSULIN, RANDOM: INSULIN: 10.2 u[IU]/mL (ref 2.6–24.9)

## 2021-01-22 LAB — TSH: TSH: 0.877 u[IU]/mL (ref 0.450–4.500)

## 2021-02-03 ENCOUNTER — Ambulatory Visit (INDEPENDENT_AMBULATORY_CARE_PROVIDER_SITE_OTHER): Payer: BC Managed Care – PPO | Admitting: Family Medicine

## 2021-02-17 ENCOUNTER — Ambulatory Visit (INDEPENDENT_AMBULATORY_CARE_PROVIDER_SITE_OTHER): Payer: BC Managed Care – PPO | Admitting: Family Medicine

## 2021-02-17 ENCOUNTER — Other Ambulatory Visit: Payer: Self-pay

## 2021-02-17 ENCOUNTER — Encounter (INDEPENDENT_AMBULATORY_CARE_PROVIDER_SITE_OTHER): Payer: Self-pay | Admitting: Family Medicine

## 2021-02-17 VITALS — BP 116/71 | HR 84 | Temp 98.3°F | Ht 67.0 in | Wt 257.0 lb

## 2021-02-17 DIAGNOSIS — E7849 Other hyperlipidemia: Secondary | ICD-10-CM | POA: Diagnosis not present

## 2021-02-17 DIAGNOSIS — Z9189 Other specified personal risk factors, not elsewhere classified: Secondary | ICD-10-CM

## 2021-02-17 DIAGNOSIS — Z6841 Body Mass Index (BMI) 40.0 and over, adult: Secondary | ICD-10-CM

## 2021-02-17 DIAGNOSIS — E8881 Metabolic syndrome: Secondary | ICD-10-CM | POA: Diagnosis not present

## 2021-02-17 DIAGNOSIS — E559 Vitamin D deficiency, unspecified: Secondary | ICD-10-CM

## 2021-02-17 DIAGNOSIS — I10 Essential (primary) hypertension: Secondary | ICD-10-CM | POA: Diagnosis not present

## 2021-02-17 MED ORDER — VITAMIN D (ERGOCALCIFEROL) 1.25 MG (50000 UNIT) PO CAPS: 50000.0000 [IU] | ORAL_CAPSULE | ORAL | 0 refills | Status: DC

## 2021-02-17 MED ORDER — SAXENDA 18 MG/3ML ~~LOC~~ SOPN: PEN_INJECTOR | SUBCUTANEOUS | 0 refills | Status: DC

## 2021-02-18 NOTE — Progress Notes (Signed)
Chief Complaint:   OBESITY Carla Watson is here to discuss her progress with her obesity treatment plan along with follow-up of her obesity related diagnoses. Carla Watson is on the Category 3 Plan and states she is following her eating plan approximately 50% of the time. Carla Watson states she is walking and stretching 30-45 minutes 3-4 times per week.  Today's visit was #: 18 Starting weight: 267 lbs Starting date: 08/29/2019 Today's weight: 257 lbs Today's date: 02/17/2021 Total lbs lost to date: 10 Total lbs lost since last in-office visit: +4  Interim History: Pt had a great holiday but it was difficult for her to stay on plan. She did meal prep this week and is back on track. She didn't drink much water over the holidays and plans to increase that back to 1 gallon a day. Pt is tolerating category 3 well, which we increased at last OV from category 2.  Subjective:   1. Other hyperlipidemia Discussed labs with patient today. Pt eats breakfast in fast food 3 days a week and usually eats out 3 other times a week.  2. Essential hypertension Discussed labs with patient today. BP at goal. Medication: HCTZ, Zestril  3. Insulin resistance Discussed labs with patient today. Carla Watson is tolerating medication(s) well without side effects.  Medication compliance is good and patient appears to be taking it as prescribed. Denies additional concerns regarding this condition. Medication: Saxenda.  4. Vitamin D deficiency She is currently taking prescription vitamin D 50,000 IU each week. She denies nausea, vomiting or muscle weakness.  5. At risk for heart disease Carla Watson is at a higher than average risk for cardiovascular disease due to obesity, elevated LDL, and insulin resistance.  Assessment/Plan:  No orders of the defined types were placed in this encounter.   Medications Discontinued During This Encounter  Medication Reason   Liraglutide -Weight Management (SAXENDA) 18 MG/3ML SOPN  Reorder   Vitamin D, Ergocalciferol, (DRISDOL) 1.25 MG (50000 UNIT) CAPS capsule Reorder     Meds ordered this encounter  Medications   Liraglutide -Weight Management (SAXENDA) 18 MG/3ML SOPN    Sig: Inject 3mg  once daily    Dispense:  15 mL    Refill:  0   Vitamin D, Ergocalciferol, (DRISDOL) 1.25 MG (50000 UNIT) CAPS capsule    Sig: Take 1 capsule (50,000 Units total) by mouth every 7 (seven) days.    Dispense:  4 capsule    Refill:  0    ** 30 day supply, needs ov for rf **     1. Other hyperlipidemia Worsening LDL likely due to her fast food intake, but improved HDL. Counseling done. Decrease saturated/trans fats.  2. Essential hypertension BP at goal. CMP essentially within normal limits.  3. Insulin resistance Carla Watson will continue to work on weight loss, exercise, and decreasing simple carbohydrates to help decrease the risk of diabetes. Carla Watson agreed to follow-up with [name] as directed to closely monitor her progress.  Refill- Liraglutide -Weight Management (SAXENDA) 18 MG/3ML SOPN; Inject 3mg  once daily  Dispense: 15 mL; Refill: 0  4. Vitamin D deficiency Low Vitamin D level contributes to fatigue and are associated with obesity, breast, and colon cancer. She agrees to continue to take prescription Vitamin D 50,000 IU every week and will follow-up for routine testing of Vitamin D, at least 2-3 times per year to avoid over-replacement. We will recheck levels in 1-2 months, after pt has taken meds consistently and while in winter.  Refill- Vitamin D,  Ergocalciferol, (DRISDOL) 1.25 MG (50000 UNIT) CAPS capsule; Take 1 capsule (50,000 Units total) by mouth every 7 (seven) days.  Dispense: 4 capsule; Refill: 0  5. At risk for heart disease Carla Watson was given approximately 15 minutes of coronary artery disease prevention counseling today. She is 41 y.o. female and has risk factors for heart disease including obesity. We discussed intensive lifestyle modifications today with an  emphasis on specific weight loss instructions and strategies.   Repetitive spaced learning was employed today to elicit superior memory formation and behavioral change.  6. Obesity with current BMI of 40.3  Carla Watson is currently in the action stage of change. As such, her goal is to continue with weight loss efforts. She has agreed to the Category 3 Plan.   Exercise goals:  As is  Behavioral modification strategies: increasing lean protein intake, decreasing eating out, and meal planning and cooking strategies. Pt's goal is to eat out 3 times a week or less.  Carla Watson has agreed to follow-up with our clinic in 2-3 weeks. She was informed of the importance of frequent follow-up visits to maximize her success with intensive lifestyle modifications for her multiple health conditions.   Objective:   Blood pressure 116/71, pulse 84, temperature 98.3 F (36.8 C), height 5\' 7"  (1.702 m), weight 257 lb (116.6 kg), SpO2 97 %. Body mass index is 40.25 kg/m.  General: Cooperative, alert, well developed, in no acute distress. HEENT: Conjunctivae and lids unremarkable. Cardiovascular: Regular rhythm.  Lungs: Normal work of breathing. Neurologic: No focal deficits.   Lab Results  Component Value Date   CREATININE 0.74 01/21/2021   BUN 16 01/21/2021   NA 137 01/21/2021   K 3.8 01/21/2021   CL 98 01/21/2021   CO2 24 01/21/2021   Lab Results  Component Value Date   ALT 14 01/21/2021   AST 19 01/21/2021   ALKPHOS 66 01/21/2021   BILITOT 1.4 (H) 01/21/2021   Lab Results  Component Value Date   HGBA1C 5.0 01/21/2021   HGBA1C 5.1 06/29/2020   HGBA1C 5.2 03/18/2020   HGBA1C 5.3 08/29/2019   Lab Results  Component Value Date   INSULIN 10.2 01/21/2021   INSULIN 14.5 06/29/2020   INSULIN 23.1 03/18/2020   INSULIN 24.9 08/29/2019   Lab Results  Component Value Date   TSH 0.877 01/21/2021   Lab Results  Component Value Date   CHOL 220 (H) 01/21/2021   HDL 70 01/21/2021   LDLCALC  134 (H) 01/21/2021   TRIG 90 01/21/2021   CHOLHDL 3.4 06/29/2020   Lab Results  Component Value Date   VD25OH 41.3 11/02/2020   VD25OH 31.5 06/29/2020   VD25OH 53.2 03/18/2020   Lab Results  Component Value Date   WBC 8.4 08/29/2019   HGB 13.8 08/29/2019   HCT 41.3 08/29/2019   MCV 88 08/29/2019   PLT 265 08/29/2019    Attestation Statements:   Reviewed by clinician on day of visit: allergies, medications, problem list, medical history, surgical history, family history, social history, and previous encounter notes.  Coral Ceo, CMA, am acting as transcriptionist for [employer], DO.  I have reviewed the above documentation for accuracy and completeness, and I agree with the above. Marjory Sneddon, D.O.  The [place] was signed into law in 2016 which includes the topic of electronic health records.  This provides immediate access to information in MyChart.  This includes consultation notes, operative notes, office notes, lab results and pathology reports.  If you have any questions about what you read please let us know at your next visit so we can discuss your concerns and take corrective action if need be.  We are right here with you.

## 2021-03-04 ENCOUNTER — Encounter (INDEPENDENT_AMBULATORY_CARE_PROVIDER_SITE_OTHER): Payer: Self-pay | Admitting: Family Medicine

## 2021-03-04 ENCOUNTER — Ambulatory Visit (INDEPENDENT_AMBULATORY_CARE_PROVIDER_SITE_OTHER): Payer: BC Managed Care – PPO | Admitting: Family Medicine

## 2021-03-04 ENCOUNTER — Other Ambulatory Visit: Payer: Self-pay

## 2021-03-04 VITALS — BP 111/72 | HR 78 | Temp 98.0°F | Ht 67.0 in | Wt 256.0 lb

## 2021-03-04 DIAGNOSIS — Z6841 Body Mass Index (BMI) 40.0 and over, adult: Secondary | ICD-10-CM | POA: Diagnosis not present

## 2021-03-04 DIAGNOSIS — E8881 Metabolic syndrome: Secondary | ICD-10-CM

## 2021-03-04 DIAGNOSIS — E669 Obesity, unspecified: Secondary | ICD-10-CM | POA: Diagnosis not present

## 2021-03-04 DIAGNOSIS — I10 Essential (primary) hypertension: Secondary | ICD-10-CM

## 2021-03-04 DIAGNOSIS — Z9189 Other specified personal risk factors, not elsewhere classified: Secondary | ICD-10-CM

## 2021-03-09 MED ORDER — SAXENDA 18 MG/3ML ~~LOC~~ SOPN: PEN_INJECTOR | SUBCUTANEOUS | 0 refills | Status: AC

## 2021-03-11 NOTE — Progress Notes (Signed)
Chief Complaint:   OBESITY Carla Watson is here to discuss her progress with her obesity treatment plan along with follow-up of her obesity related diagnoses. Carla Watson is on the Category 3 Plan and states she is following her eating plan approximately 70% of the time. Carla Watson states she is walking for 30 minutes and boot camp for 45 minutes 3-4 times per week.  Today's visit was #: 27 Starting weight: 267 lbs Starting date: 08/29/2019 Today's weight: 256 lbs Today's date: 03/04/2021 Total lbs lost to date: 11 Total lbs lost since last in-office visit: 1  Interim History: Carla Watson has decreased her eating out and she did better with meal prepping lately. She has done less snacking as well. She has no issues with the plan or concerns today. She gained 4 lbs last office visit.  Subjective:   1. Essential hypertension Carla Watson's blood pressure is stable. She is taking lisinopril and hydrochlorothiazide. She has no symptoms of hypotension or concerns.  BP Readings from Last 3 Encounters:  03/04/21 111/72  02/17/21 116/71  01/21/21 112/71   2. Insulin resistance Carla Watson is tolerating Saxenda well with no issues or concerns. Her cravings and portions have decreased a lot on her medications.  3. At risk for diabetes mellitus Carla Watson is at higher than average risk for developing diabetes due to insulin resistance and continued struggles with weight loss.   Assessment/Plan:  No orders of the defined types were placed in this encounter.   Medications Discontinued During This Encounter  Medication Reason   Liraglutide -Weight Management (SAXENDA) 18 MG/3ML SOPN Reorder     Meds ordered this encounter  Medications   Liraglutide -Weight Management (SAXENDA) 18 MG/3ML SOPN    Sig: Inject 3mg  once daily    Dispense:  15 mL    Refill:  0     1. Essential hypertension Carla Watson will continue her medications, and will continue to work on healthy weight loss and exercise to improve blood  pressure control. We will watch for signs of hypotension as she continues her lifestyle modifications.  2. Insulin resistance We will refill Saxenda at 3 mg daily for 1 month, as she will likely run out before her next office visit. Carla Watson will continue to work on weight loss, exercise, and decreasing simple carbohydrates to help decrease the risk of diabetes. We will consider changing to [facility] in the near future. Carla Watson agreed to follow-up with Carla Watson as directed to closely monitor her progress.  - Liraglutide -Weight Management (SAXENDA) 18 MG/3ML SOPN; Inject 3mg  once daily  Dispense: 15 mL; Refill: 0  3. At risk for diabetes mellitus Carla Watson was given approximately 9 minutes of diabetes education and counseling today. We discussed intensive lifestyle modifications today with an emphasis on weight loss as well as increasing exercise and decreasing simple carbohydrates in her diet. We also reviewed medication options with an emphasis on risk versus benefit of those discussed.   Repetitive spaced learning was employed today to elicit superior memory formation and behavioral change.  4. Obesity with current BMI of 40.1 Carla Watson is currently in the action stage of change. As such, her goal is to continue with weight loss efforts. She has agreed to the Category 3 Plan.   Exercise goals: As is, increase as tolerated.  Behavioral modification strategies: meal planning and cooking strategies, keeping healthy foods in the home, and planning for success.  Carla Watson has agreed to follow-up with our clinic in 2 to 3 weeks. She was informed of the importance of  frequent follow-up visits to maximize her success with intensive lifestyle modifications for her multiple health conditions.   Objective:   Blood pressure 111/72, pulse 78, temperature 98 F (36.7 C), height 5\' 7"  (1.702 m), weight 256 lb (116.1 kg), SpO2 96 %. Body mass index is 40.1 kg/m.  General: Cooperative, alert, well developed, in no  acute distress. HEENT: Conjunctivae and lids unremarkable. Cardiovascular: Regular rhythm.  Lungs: Normal work of breathing. Neurologic: No focal deficits.   Lab Results  Component Value Date   CREATININE 0.74 01/21/2021   BUN 16 01/21/2021   NA 137 01/21/2021   K 3.8 01/21/2021   CL 98 01/21/2021   CO2 24 01/21/2021   Lab Results  Component Value Date   ALT 14 01/21/2021   AST 19 01/21/2021   ALKPHOS 66 01/21/2021   BILITOT 1.4 (H) 01/21/2021   Lab Results  Component Value Date   HGBA1C 5.0 01/21/2021   HGBA1C 5.1 06/29/2020   HGBA1C 5.2 03/18/2020   HGBA1C 5.3 08/29/2019   Lab Results  Component Value Date   INSULIN 10.2 01/21/2021   INSULIN 14.5 06/29/2020   INSULIN 23.1 03/18/2020   INSULIN 24.9 08/29/2019   Lab Results  Component Value Date   TSH 0.877 01/21/2021   Lab Results  Component Value Date   CHOL 220 (H) 01/21/2021   HDL 70 01/21/2021   LDLCALC 134 (H) 01/21/2021   TRIG 90 01/21/2021   CHOLHDL 3.4 06/29/2020   Lab Results  Component Value Date   VD25OH 41.3 11/02/2020   VD25OH 31.5 06/29/2020   VD25OH 53.2 03/18/2020   Lab Results  Component Value Date   WBC 8.4 08/29/2019   HGB 13.8 08/29/2019   HCT 41.3 08/29/2019   MCV 88 08/29/2019   PLT 265 08/29/2019   No results found for: IRON, TIBC, FERRITIN  Attestation Statements:   Reviewed by clinician on day of visit: allergies, medications, problem list, medical history, surgical history, family history, social history, and previous encounter notes.   08/31/2019, am acting as transcriptionist for Trude Mcburney, DO.  I have reviewed the above documentation for accuracy and completeness, and I agree with the above. [employer], D.O.  The 21st Century Cures Act was signed into law in 2016 which includes the topic of electronic health records.  This provides immediate access to information in MyChart.  This includes consultation notes, operative notes, office notes, lab  results and pathology reports.  If you have any questions about what you read please let 2017 know at your next visit so we can discuss your concerns and take corrective action if need be.  We are right here with you.

## 2021-03-22 ENCOUNTER — Ambulatory Visit (INDEPENDENT_AMBULATORY_CARE_PROVIDER_SITE_OTHER): Payer: BC Managed Care – PPO | Admitting: Physician Assistant

## 2021-03-22 ENCOUNTER — Encounter (INDEPENDENT_AMBULATORY_CARE_PROVIDER_SITE_OTHER): Payer: Self-pay | Admitting: Physician Assistant

## 2021-03-22 ENCOUNTER — Other Ambulatory Visit: Payer: Self-pay

## 2021-03-22 VITALS — BP 103/65 | HR 78 | Temp 98.0°F | Ht 67.0 in | Wt 254.0 lb

## 2021-03-22 DIAGNOSIS — Z9189 Other specified personal risk factors, not elsewhere classified: Secondary | ICD-10-CM | POA: Diagnosis not present

## 2021-03-22 DIAGNOSIS — E559 Vitamin D deficiency, unspecified: Secondary | ICD-10-CM

## 2021-03-22 DIAGNOSIS — E669 Obesity, unspecified: Secondary | ICD-10-CM | POA: Diagnosis not present

## 2021-03-22 DIAGNOSIS — Z6839 Body mass index (BMI) 39.0-39.9, adult: Secondary | ICD-10-CM

## 2021-03-22 MED ORDER — VITAMIN D (ERGOCALCIFEROL) 1.25 MG (50000 UNIT) PO CAPS: 50000.0000 [IU] | ORAL_CAPSULE | ORAL | 0 refills | Status: AC

## 2021-03-22 NOTE — Progress Notes (Signed)
Chief Complaint:   OBESITY Carla Watson is here to discuss her progress with her obesity treatment plan along with follow-up of her obesity related diagnoses. Carla Watson is on the Category 3 Plan and states she is following her eating plan approximately 70% of the time. Carla Watson states she is doing [employer] 45 minutes 3-4 times per week.  Today's visit was #: 28 Starting weight: 267 lbs Starting date: 08/29/2019 Today's weight: 254 lbs Today's date: 03/22/2021 Total lbs lost to date: 13 Total lbs lost since last in-office visit: 2  Interim History: Pt did well with weight loss. She reports that weekends are the biggest struggle for her, as she is out of routine. She is on 3 mg Saxenda and tolerating it well, but reports she is not hungry on some days. She is not eating her snack calories, unless she uses them for a dessert once a week.   Subjective:   1. Vitamin D deficiency Pt's last Vit D level was 41.3. She is on weekly Vit D.  2. At risk for impaired metabolic function Carla Watson is at increased risk for impaired metabolic function due to not eating all the food on plan.  Assessment/Plan:   1. Vitamin D deficiency Low Vitamin D level contributes to fatigue and are associated with obesity, breast, and colon cancer. She agrees to continue to take prescription Vitamin D 50,000 IU every week and will follow-up for routine testing of Vitamin D, at least 2-3 times per year to avoid over-replacement.  Refill- Vitamin D, Ergocalciferol, (DRISDOL) 1.25 MG (50000 UNIT) CAPS capsule; Take 1 capsule (50,000 Units total) by mouth every 7 (seven) days.  Dispense: 4 capsule; Refill: 0  2. At risk for impaired metabolic function Carla Watson was given approximately 15 minutes of impaired  metabolic function prevention counseling today. We discussed intensive lifestyle modifications today with an emphasis on specific nutrition and exercise instructions and strategies.   Repetitive spaced learning was  employed today to elicit superior memory formation and behavioral change.  3. Obesity with current BMI of 39.77 Carla Watson is currently in the action stage of change. As such, her goal is to continue with weight loss efforts. She has agreed to the Category 3 Plan.   Use snack calories; consider decreasing Saxenda to 2.4 mg if pt can't eat all of her food.  Exercise goals:  As is  Behavioral modification strategies: meal planning and cooking strategies.  Carla Watson has agreed to follow-up with our clinic in 2 weeks. She was informed of the importance of frequent follow-up visits to maximize her success with intensive lifestyle modifications for her multiple health conditions.   Objective:   Blood pressure 103/65, pulse 78, temperature 98 F (36.7 C), height 5\' 7"  (1.702 m), weight 254 lb (115.2 kg), SpO2 97 %. Body mass index is 39.78 kg/m.  General: Cooperative, alert, well developed, in no acute distress. HEENT: Conjunctivae and lids unremarkable. Cardiovascular: Regular rhythm.  Lungs: Normal work of breathing. Neurologic: No focal deficits.   Lab Results  Component Value Date   CREATININE 0.74 01/21/2021   BUN 16 01/21/2021   NA 137 01/21/2021   K 3.8 01/21/2021   CL 98 01/21/2021   CO2 24 01/21/2021   Lab Results  Component Value Date   ALT 14 01/21/2021   AST 19 01/21/2021   ALKPHOS 66 01/21/2021   BILITOT 1.4 (H) 01/21/2021   Lab Results  Component Value Date   HGBA1C 5.0 01/21/2021   HGBA1C 5.1 06/29/2020   HGBA1C  5.2 03/18/2020   HGBA1C 5.3 08/29/2019   Lab Results  Component Value Date   INSULIN 10.2 01/21/2021   INSULIN 14.5 06/29/2020   INSULIN 23.1 03/18/2020   INSULIN 24.9 08/29/2019   Lab Results  Component Value Date   TSH 0.877 01/21/2021   Lab Results  Component Value Date   CHOL 220 (H) 01/21/2021   HDL 70 01/21/2021   LDLCALC 134 (H) 01/21/2021   TRIG 90 01/21/2021   CHOLHDL 3.4 06/29/2020   Lab Results  Component Value Date   VD25OH  41.3 11/02/2020   VD25OH 31.5 06/29/2020   VD25OH 53.2 03/18/2020   Lab Results  Component Value Date   WBC 8.4 08/29/2019   HGB 13.8 08/29/2019   HCT 41.3 08/29/2019   MCV 88 08/29/2019   PLT 265 08/29/2019   Attestation Statements:   Reviewed by clinician on day of visit: allergies, medications, problem list, medical history, surgical history, family history, social history, and previous encounter notes.  Edmund Hilda, CMA, am acting as transcriptionist for [employer], PA-C.  I have reviewed the above documentation for accuracy and completeness, and I agree with the above. Alois Cliche, PA-C

## 2021-04-07 ENCOUNTER — Ambulatory Visit (INDEPENDENT_AMBULATORY_CARE_PROVIDER_SITE_OTHER): Payer: BC Managed Care – PPO | Admitting: Adult Health

## 2021-04-25 ENCOUNTER — Other Ambulatory Visit (INDEPENDENT_AMBULATORY_CARE_PROVIDER_SITE_OTHER): Payer: Self-pay | Admitting: Physician Assistant

## 2021-04-25 DIAGNOSIS — E559 Vitamin D deficiency, unspecified: Secondary | ICD-10-CM

## 2021-04-26 NOTE — Telephone Encounter (Signed)
Carla Watson 

## 2021-08-10 ENCOUNTER — Other Ambulatory Visit: Payer: Self-pay | Admitting: Obstetrics

## 2021-08-10 DIAGNOSIS — Z1231 Encounter for screening mammogram for malignant neoplasm of breast: Secondary | ICD-10-CM

## 2021-09-06 ENCOUNTER — Ambulatory Visit
Admission: RE | Admit: 2021-09-06 | Discharge: 2021-09-06 | Disposition: A | Discharge status: Home / Self Care | Payer: BC Managed Care – PPO | Source: Ambulatory Visit | Attending: Obstetrics | Admitting: Obstetrics

## 2021-09-06 DIAGNOSIS — Z1231 Encounter for screening mammogram for malignant neoplasm of breast: Secondary | ICD-10-CM | POA: Diagnosis present

## 2021-09-22 ENCOUNTER — Encounter (INDEPENDENT_AMBULATORY_CARE_PROVIDER_SITE_OTHER): Payer: Self-pay

## 2021-11-25 ENCOUNTER — Other Ambulatory Visit: Payer: Self-pay | Admitting: Orthopedic Surgery

## 2021-11-25 DIAGNOSIS — M25551 Pain in right hip: Secondary | ICD-10-CM

## 2021-11-25 DIAGNOSIS — M25859 Other specified joint disorders, unspecified hip: Secondary | ICD-10-CM

## 2021-12-13 ENCOUNTER — Ambulatory Visit
Admission: RE | Admit: 2021-12-13 | Discharge: 2021-12-13 | Disposition: A | Discharge status: Home / Self Care | Payer: BC Managed Care – PPO | Source: Ambulatory Visit | Attending: Orthopedic Surgery | Admitting: Orthopedic Surgery

## 2021-12-13 DIAGNOSIS — M25859 Other specified joint disorders, unspecified hip: Secondary | ICD-10-CM

## 2021-12-13 DIAGNOSIS — M25551 Pain in right hip: Secondary | ICD-10-CM

## 2022-06-05 ENCOUNTER — Ambulatory Visit
Admission: EM | Admit: 2022-06-05 | Discharge: 2022-06-05 | Disposition: A | Discharge status: Home / Self Care | Payer: BC Managed Care – PPO | Attending: Emergency Medicine | Admitting: Emergency Medicine

## 2022-06-05 DIAGNOSIS — J302 Other seasonal allergic rhinitis: Secondary | ICD-10-CM

## 2022-06-05 DIAGNOSIS — J45901 Unspecified asthma with (acute) exacerbation: Secondary | ICD-10-CM | POA: Diagnosis not present

## 2022-06-05 MED ORDER — ALBUTEROL SULFATE HFA 108 (90 BASE) MCG/ACT IN AERS: 1.0000 | INHALATION_SPRAY | Freq: Four times a day (QID) | RESPIRATORY_TRACT | 0 refills | Status: AC | PRN

## 2022-06-05 MED ORDER — PREDNISONE 10 MG PO TABS: 40.0000 mg | ORAL_TABLET | Freq: Every day | ORAL | 0 refills | Status: AC

## 2022-06-05 NOTE — ED Provider Notes (Signed)
[facility]    CSN: [id number] Arrival date & time: 06/05/22  1400      History   Chief Complaint Chief Complaint  Patient presents with   Wheezing    HPI Carla Watson is a 42 y.o. female.  Patient presents with 2-week history of postnasal drip, runny nose, congestion.  She has a cough and wheezing x 1 week.  She denies fever, chills, sore throat, chest pain, shortness of breath, or other symptoms.  She has been using her albuterol inhaler and taking Mucinex.  Her medical history includes asthma, hypertension, hyperlipidemia, IBS, obesity, PCOS.   The history is provided by the patient and medical records.    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 Authorizing Provider  albuterol (VENTOLIN HFA) 108 (90 Base) MCG/ACT inhaler Inhale 1-2 puffs into the lungs every 6 (six) hours as needed. 06/05/22  Yes  Mickie Bail, NP  predniSONE (DELTASONE) 10 MG tablet Take 4 tablets (40 mg total) by mouth daily for 5 days. 06/05/22 06/10/22 Yes Mickie Bail, NP  hydrochlorothiazide (HYDRODIURIL) 12.5 MG tablet Take 12.5 mg by mouth daily. 09/28/20   [provider]  Insulin Pen Needle (BD PEN NEEDLE NANO 2ND GEN) 32G X 4 MM MISC Use 1 needle daily to inject Saxenda. 12/22/20   Thomasene Lot, DO  levonorgestrel (MIRENA) 20 MCG/24HR IUD 1 each by Intrauterine route once.     [provider]  Liraglutide -Weight Management (SAXENDA) 18 MG/3ML SOPN Inject  once daily 03/09/21   Opalski, Deborah, DO  lisinopril (ZESTRIL) 10 MG tablet Take 1 tablet by mouth daily. 09/01/20   [provider]  loratadine (CLARITIN) 10 MG tablet Take 10 mg by mouth daily.    [provider]  Multiple Vitamin (MULTIVITAMIN ADULT) TABS Take 1 tablet by mouth daily.    [provider]  sertraline (ZOLOFT) 50 MG tablet Take 1 tablet by mouth daily. 05/22/17   [provider]  Vitamin  D, Ergocalciferol, (DRISDOL) 1.25 MG (50000 UNIT) CAPS capsule Take 1 capsule (50,000 Units total) by mouth every 7 (seven) days. 03/22/21   Alois Cliche, PA-C    Family History Family History  Problem Relation Age of Onset   Hypertension Mother    Obesity Mother    Hypertension Father    Hyperlipidemia Father    Sleep apnea Father    Obesity Father    COPD Maternal Grandmother    Brain cancer Paternal Grandfather    Lung cancer Paternal Grandfather    Hypertension Paternal Grandfather    Breast cancer Neg Hx     Social History Social History   Tobacco Use   Smoking status: Never   Smokeless tobacco: Never  Vaping Use   Vaping Use: Never used  Substance Use Topics   Alcohol use: Yes    Comment: rare   Drug use: No     Allergies   Pertussis vaccines, Tape, and Tapentadol   Review of Systems Review of Systems  Constitutional:  Negative for chills and fever.  HENT:  Positive  for congestion, postnasal drip and rhinorrhea. Negative for ear pain and sore throat.   Respiratory:  Positive for cough and wheezing. Negative for shortness of breath.   Cardiovascular:  Negative for chest pain and palpitations.  Gastrointestinal:  Negative for abdominal pain, diarrhea and vomiting.  Skin:  Negative for color change and rash.  All other systems reviewed and are negative.    Physical Exam Triage Vital Signs ED Triage Vitals  Enc Vitals Group     BP 06/05/22 1412 117/78     Pulse Rate 06/05/22 1412 76     Resp 06/05/22 1412 17     Temp 06/05/22 1412 98.7 F (37.1 C)     Temp src --      SpO2 06/05/22 1412 97 %     Weight --      Height --      Head Circumference --      Peak Flow --      Pain Score 06/05/22 1411 0     Pain Loc --      Pain Edu? --      Excl. in GC? --    No data found.  Updated Vital Signs BP 117/78   Pulse 76   Temp 98.7 F (37.1 C)   Resp 17   SpO2 97%   Visual Acuity Right Eye Distance:   Left Eye Distance:   Bilateral Distance:    Right Eye Near:   Left Eye Near:    Bilateral Near:     Physical Exam Vitals and nursing note reviewed.  Constitutional:      General: She is not in acute distress.    Appearance: She is well-developed. She is not ill-appearing.  HENT:     Right Ear: Tympanic membrane normal.     Left Ear: Tympanic membrane normal.     Nose: Nose normal.     Mouth/Throat:     Mouth: Mucous membranes are moist.     Pharynx: Oropharynx is clear.     Comments: Clear PND. Cardiovascular:     Rate and Rhythm: Normal rate and regular rhythm.     Heart sounds: Normal heart sounds.  Pulmonary:     Effort: Pulmonary effort is normal. No respiratory distress.     Breath sounds: Normal breath sounds. No wheezing.  Musculoskeletal:     Cervical back: Neck supple.  Skin:    General: Skin  is warm and dry.  Neurological:     Mental Status: She is alert.  Psychiatric:        Mood and Affect: Mood normal.         Behavior: Behavior normal.      UC Treatments / Results  Labs (all labs ordered are listed, but only abnormal results are displayed) Labs Reviewed - No data to display  EKG   Radiology No results found.  Procedures Procedures (including critical care time)  Medications Ordered in UC Medications - No data to display  Initial Impression / Assessment and Plan / UC Course  I have reviewed the triage vital signs and the nursing notes.  Pertinent labs & imaging results that were available during my care of the patient were reviewed by me and considered in my medical decision making (see chart for details).    Asthma exacerbation, seasonal allergies.  Afebrile and vital signs are stable.  No respiratory distress.  Lungs are clear and O2 sat is 97% on room air.  Treating with albuterol inhaler and prednisone.  Education provided on asthma.  Instructed patient to follow-up with her PCP if her symptoms are not improving.  She agrees to plan of care.  Final Clinical Impressions(s) / UC Diagnoses   Final diagnoses:  Asthma with acute exacerbation, unspecified asthma severity, unspecified whether persistent  Seasonal allergies     Discharge Instructions      Take the prednisone and use the albuterol inhaler as directed.  Follow up with your primary care provider if your symptoms are not improving.        ED Prescriptions     Medication Sig Dispense Auth. Provider   albuterol (VENTOLIN HFA) 108 (90 Base) MCG/ACT inhaler Inhale 1-2 puffs into the lungs every 6 (six) hours as needed. 18 g Mickie Bail, NP   predniSONE (DELTASONE) 10 MG tablet Take 4 tablets (40 mg total) by mouth daily for 5 days. 20 tablet Mickie Bail, NP      PDMP not reviewed this encounter.   Mickie Bail, NP 06/05/22 1453

## 2022-06-05 NOTE — ED Triage Notes (Signed)
Patient to Urgent Care with complaints of wheezing/ URI symptoms.  URI/ sinus pain/ post nasal drip x2 weeks. Developed a cough and wheezing over a week ago. Productive cough.   Taking mucinex.

## 2022-06-05 NOTE — Discharge Instructions (Addendum)
Take the prednisone and use the albuterol inhaler as directed.  Follow up with your primary care provider if your symptoms are not improving.     

## 2022-07-04 ENCOUNTER — Encounter: Payer: BC Managed Care – PPO | Attending: Family Medicine | Admitting: Dietician

## 2022-07-04 ENCOUNTER — Encounter: Payer: Self-pay | Admitting: Dietician

## 2022-07-04 VITALS — Ht 67.0 in | Wt 271.3 lb

## 2022-07-04 DIAGNOSIS — I10 Essential (primary) hypertension: Secondary | ICD-10-CM | POA: Diagnosis not present

## 2022-07-04 DIAGNOSIS — E782 Mixed hyperlipidemia: Secondary | ICD-10-CM

## 2022-07-04 DIAGNOSIS — E559 Vitamin D deficiency, unspecified: Secondary | ICD-10-CM | POA: Diagnosis not present

## 2022-07-04 DIAGNOSIS — Z6841 Body Mass Index (BMI) 40.0 and over, adult: Secondary | ICD-10-CM

## 2022-07-04 NOTE — Patient Instructions (Addendum)
Eat more breakfast meals at home such as low fat breakfast sandwich Track food intake to monitor calories, with goal of about 1700 daily while exercising regularly Make diet coke last longer to control portion.  Include generous portions of veggies with meals and control portions of starches, extra fats, and meats. Continue regular exercise to control insulin resistance and help manage stress.

## 2022-07-04 NOTE — Progress Notes (Signed)
Medical Nutrition Therapy: Visit start time: 1510  end time: 1615  Assessment:   Referral Diagnosis: hyperlipidemia, hypertension, vitamin D deficiency Other medical history/ diagnoses: PCOS, insulin resistance, obesity, asthma, seasonal allergies Psychosocial issues/ stress concerns: anxiety, high stress level  Medications, supplements: reconciled list in medical record   Preferred learning method:  Auditory Visual Hands-on   Current weight: 271.3lbs Height: 5'7" BMI: 42.49 Patient's personal weight goal: 200lbs  Progress and evaluation:  Patient reports lowest weight recently of 250lbs about 1 year ago Has been ill for past month and taking prednisone  She has tried higher protein diet and limited carbs from time to time to help manage weight. Past month has been difficult due to illness. Recent labs (03/22/22) indicate: total cholesterol 216, HDL 58, LDL 130, triglycerides 142. Food allergies: none Special diet practices: none Patient seeks help with reducing health risk through healthy eating and weigh loss Next PCP appt is 09/2022   Dietary Intake:  Usual eating pattern includes 3 meals and 0-1 snacks per day. Dining out frequency: 9 meals per week. Who plans meals/ buys groceries? self Who prepares meals? self  Breakfast: usually [employer] diet coke + sausage egg mcmuffin, other  Snack: none Lunch: brings from home -- today yogurt, strawberries, [place], cheese wedge, graham [employer]x; [place] sandwich and fruit; salad with [place] or chicken; occ snack foods Snack: none Supper: often takeout (eats alone) due to time, convenience; occ preps on Sundays grilled chicken salad; sometimes fast foods Snack: dessert foods Beverages: water 64oz+ daily; diet soda  Physical activity: walking, weights 35-45 minutes 3-4 times a week, less recently due to illness   Intervention:   Nutrition Care Education:   Basic nutrition: basic food groups; appropriate nutrient balance; appropriate  meal and snack schedule; general nutrition guidelines    Weight control: importance of low sugar and low fat choices; portion control; estimated energy needs for weight loss at 1600-1800 kcal, provided guidance for 45% CHO, 25% pro, 30% fat; discussed tracking food intake; role of physical activity Hypertension:  identifying high sodium foods; identifying food sources of potassium, magnesium Hyperlipidemia:  target goals for lipids; healthy and unhealthy fats; role of fiber, plant sterols; role of exercise  Other intervention notes: Patient has been working on positive diet changes and is motivated to continue.  Established goals with direction from patient No follow up scheduled at this time; she will schedule after next medical visit if needed.   Nutritional Diagnosis:  [place]-2.2 Altered nutrition-related laboratory As related to hyperlipidemia and hypertension.  As evidenced by elevated total cholesterol and LDL. [place]-3.3 Overweight/obesity As related to excess calories, inadequate physical activity, high stress level.  As evidenced by patient with current BMI of 42.49.   Education Materials given:  [employer] guidelines for Cholesterol-lowering/ Heart health Designer, industrial/product with food lists, sample meal pattern Food to Choose to Lower Cholesterol Visit summary with goals/ instructions   Learner/ who was taught:  Patient   Level of understanding: Verbalizes/ demonstrates competency  Demonstrated degree of understanding via:   Teach back Learning barriers: None  Willingness to learn/ readiness for change: Eager, change in progress  Monitoring and Evaluation:  Dietary intake, exercise, blood lipids, BP control, and body weight      follow up: prn

## 2022-09-20 ENCOUNTER — Other Ambulatory Visit: Payer: Self-pay | Admitting: Family Medicine

## 2022-09-20 DIAGNOSIS — Z1231 Encounter for screening mammogram for malignant neoplasm of breast: Secondary | ICD-10-CM

## 2022-09-23 ENCOUNTER — Ambulatory Visit
Admission: RE | Admit: 2022-09-23 | Discharge: 2022-09-23 | Disposition: A | Discharge status: Home / Self Care | Payer: BC Managed Care – PPO | Source: Ambulatory Visit | Attending: Family Medicine | Admitting: Family Medicine

## 2022-09-23 DIAGNOSIS — Z1231 Encounter for screening mammogram for malignant neoplasm of breast: Secondary | ICD-10-CM | POA: Diagnosis present

## 2022-12-20 ENCOUNTER — Encounter: Payer: Self-pay | Admitting: Podiatry

## 2022-12-20 ENCOUNTER — Ambulatory Visit (INDEPENDENT_AMBULATORY_CARE_PROVIDER_SITE_OTHER): Payer: BC Managed Care – PPO

## 2022-12-20 ENCOUNTER — Ambulatory Visit: Payer: BC Managed Care – PPO | Admitting: Podiatry

## 2022-12-20 DIAGNOSIS — M7661 Achilles tendinitis, right leg: Secondary | ICD-10-CM

## 2022-12-20 NOTE — Progress Notes (Signed)
Chief Complaint  Patient presents with   Foot Pain    "I'm pretty sure it's my achilles tendon, sometime I have it in both but it's mainly in the right, right now." N - achilles pain L - right posterior D - 6 mos.,  O - off and on, worse past 2 mos C - sometime sharp pain, burning pain, throb, sore  A - walking, jumping, running T - stopped exercising, ice, compression sleeve, rest, Meloxicam sometime,     HPI: 42 y.o. female presenting today as a reestablish new patient for evaluation of pain and tenderness associated to the right Achilles tendon.  Patient denies any history of acute injury.  Patient has had pain and tenderness over the past 6 months intermittently.  She says it has been exacerbated over the past 2 months.  Presenting for further treatment evaluation  Past Medical History:  Diagnosis Date   Acne    Anxiety 2015   Asthma    Back pain    Backache    Constipation    High cholesterol    Hypertension    IBS (irritable bowel syndrome)    Joint pain    Mood disorder (HCC)    Obesity    PCOS (polycystic ovarian syndrome)    Vitamin D deficiency     Past Surgical History:  Procedure Laterality Date   BACK SURGERY  2014   COLPOSCOPY W/ BIOPSY / CURETTAGE  2015   2016, 2017    Allergies  Allergen Reactions   Pertussis Vaccines Other (See Comments)    Seizure Disorder    Tape Rash   Tapentadol Rash     Physical Exam: General: The patient is alert and oriented x3 in no acute distress.  Dermatology: Skin is warm, dry and supple bilateral lower extremities. Negative for open lesions or macerations.  Vascular: Palpable pedal pulses bilaterally. No edema or erythema noted. Capillary refill within normal limits.  Neurological: Epicritic and protective threshold grossly intact bilaterally.   Musculoskeletal Exam: Pain on palpation to the midportion of the Achilles tendon right with enlargement and thickening of the Achilles tendon approximately 3-6 weeks  which is cm proximal to the insertion of the calcaneus.  Limited ankle joint dorsiflexion noted with a tight posterior complex.  Muscle strength 5/5 in all muscle groups bilateral lower extremities.  Radiographic Exam RT foot 12/20/2022:  Posterior calcaneal spur noted to the respective calcaneus on lateral view. No fracture or dislocation noted. Normal osseous mineralization noted.   Impression: Posterior and plantar heel spur right  Assessment: 1.  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  Gastrocnemius equinus bilateral  Plan of Care:  -Patient evaluated.  X-rays reviewed -Today we discussed different treatment options conservatively.  I do not believe at the moment the patient requires or surgery is warranted. -Order placed for physical therapy at [facility].  I do believe the physical therapy and helping to stretch the posterior complex of the Achilles will help alleviate a lot of the patient's symptoms and pain -Shoe gear was discussed.  Continue good supportive shoe gear and advised against going barefoot. -Continue meloxicam 15 mg daily.  Patient has a prescription active for unrelated issues -Ultimately the patient declined prednisone pack -Return to clinic as needed  Sister-in-law is Carla Cruise (Ward) Carla Watson, ED physician. 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   Carla Watson, DPM [facility]  Dr. Felecia Watson, DPM  [address] [place], [place] [address]                Office [phone]  [phone] [phone]

## 2022-12-21 ENCOUNTER — Encounter: Payer: Self-pay | Admitting: Podiatry

## 2023-08-21 ENCOUNTER — Other Ambulatory Visit: Payer: Self-pay | Admitting: Family Medicine

## 2023-08-21 DIAGNOSIS — Z1231 Encounter for screening mammogram for malignant neoplasm of breast: Secondary | ICD-10-CM

## 2023-09-25 ENCOUNTER — Ambulatory Visit
Admission: RE | Admit: 2023-09-25 | Discharge: 2023-09-25 | Disposition: A | Discharge status: Home / Self Care | Payer: Self-pay | Source: Ambulatory Visit | Attending: Family Medicine | Admitting: Family Medicine

## 2023-09-25 DIAGNOSIS — Z1231 Encounter for screening mammogram for malignant neoplasm of breast: Secondary | ICD-10-CM | POA: Diagnosis present
